# Patient Record
Sex: Female | Born: 1975 | Race: White | Hispanic: No | Marital: Married | Smoking: Former smoker
Health system: Southern US, Community
[De-identification: ages and names within clinical notes are randomized; demographics above are authoritative.]

## PROBLEM LIST (undated history)

## (undated) DIAGNOSIS — Z789 Other specified health status: Secondary | ICD-10-CM

---

## 2005-01-10 HISTORY — PX: MOLE REMOVAL: SHX2046

## 2007-12-25 ENCOUNTER — Ambulatory Visit: Payer: Self-pay | Admitting: Family Medicine

## 2007-12-25 DIAGNOSIS — J019 Acute sinusitis, unspecified: Secondary | ICD-10-CM

## 2007-12-25 DIAGNOSIS — J309 Allergic rhinitis, unspecified: Secondary | ICD-10-CM | POA: Insufficient documentation

## 2007-12-25 DIAGNOSIS — J45990 Exercise induced bronchospasm: Secondary | ICD-10-CM

## 2007-12-25 DIAGNOSIS — G43009 Migraine without aura, not intractable, without status migrainosus: Secondary | ICD-10-CM | POA: Insufficient documentation

## 2007-12-25 DIAGNOSIS — D239 Other benign neoplasm of skin, unspecified: Secondary | ICD-10-CM | POA: Insufficient documentation

## 2007-12-25 DIAGNOSIS — B9689 Other specified bacterial agents as the cause of diseases classified elsewhere: Secondary | ICD-10-CM

## 2008-05-28 ENCOUNTER — Inpatient Hospital Stay (HOSPITAL_COMMUNITY): Admission: AD | Admit: 2008-05-28 | Discharge: 2008-05-31 | Payer: Self-pay | Admitting: Obstetrics and Gynecology

## 2008-05-28 ENCOUNTER — Encounter (INDEPENDENT_AMBULATORY_CARE_PROVIDER_SITE_OTHER): Payer: Self-pay | Admitting: Obstetrics and Gynecology

## 2008-06-01 ENCOUNTER — Encounter: Admission: RE | Admit: 2008-06-01 | Discharge: 2008-06-03 | Payer: Self-pay | Admitting: Obstetrics and Gynecology

## 2008-06-14 ENCOUNTER — Ambulatory Visit: Payer: Self-pay | Admitting: Pediatrics

## 2009-02-23 ENCOUNTER — Telehealth: Payer: Self-pay | Admitting: Family Medicine

## 2009-04-16 ENCOUNTER — Ambulatory Visit: Payer: Self-pay | Admitting: Family Medicine

## 2009-08-20 ENCOUNTER — Ambulatory Visit: Payer: Self-pay | Admitting: Family Medicine

## 2009-08-20 DIAGNOSIS — J029 Acute pharyngitis, unspecified: Secondary | ICD-10-CM

## 2009-09-07 ENCOUNTER — Telehealth: Payer: Self-pay | Admitting: Family Medicine

## 2010-01-21 ENCOUNTER — Ambulatory Visit
Admission: RE | Admit: 2010-01-21 | Discharge: 2010-01-21 | Payer: Self-pay | Source: Home / Self Care | Attending: Family Medicine | Admitting: Family Medicine

## 2010-02-09 NOTE — Progress Notes (Signed)
Summary: call a nurse  Phone Note Call from Patient   Summary of Call: Triage Record Num: [id number] Operator: Joneen Boers Patient Name: Tasha Diaz Call Date & Time: 09/06/2009 8:36:10PM Patient Phone: [phone] PCP: Patient Gender: Female PCP Fax : Patient DOB: Oct 02, 1975 Practice Name: [facility] Reason for Call: Screener data: hit R leg at pool today and has a goose egg on it. is concerned. Rn triage: shinbone 2" below knee, size of a silver dollar, blue/purple in color, occured when rising form a chair and hit shinbone on the arm of it. Had one other unexplained bruise on upper thigh of same leg that has resolved. No other s/s bleeding . Leg injury protocol/all other situations/home care advice given. Protocol(s) Used: Leg Injury Recommended Outcome per Protocol: Provide Home/Self Care Reason for Outcome: All other situations Care Advice:  ~ Avoid activity that causes or worsens symptoms.  ~ See provider if pain continues for 7 days with home care.  ~ SYMPTOM / CONDITION MANAGEMENT Go to ED immediately if extremity: - is cool below injury - has a change in sensation (numbness, tingling) - has a noticeable change in color (pale, bluish)  ~ Limit Swelling and Reduce Pain: - Rest the painful area and limit weight bearing or gripping, and any strenuous exercise, especially repetitive motion, playing tennis, or any activity that makes the pain worse. - Apply a cloth-covered cold pack to the extremity for no more than 20 minutes, 4 to 8 times a day. Cold helps relieve pain and swelling. - Apply an elastic bandage to the extremity to limit the swelling. Do not wrap extremity too tightly. - Elevate the extremity above the level of the heart.  ~ 09/06/2009 8:43:43PM Page 1 of 1 [email or web address] Initial call taken by: Melody Comas,  September 07, 2009 8:50 AM

## 2010-02-09 NOTE — Assessment & Plan Note (Signed)
Summary: CONGESTION/COUGH/DLO   Vital Signs:  Patient profile:   35 year old female Height:      64 inches Weight:      130.4 pounds BMI:     22.46 Temp:     98.3 degrees F oral Pulse rate:   80 / minute Pulse rhythm:   regular BP sitting:   118 / 78  (left arm) Cuff size:   regular  Vitals Entered By: Benny Lennert CMA Duncan Dull) (April 16, 2009 3:56 PM)  History of Present Illness: Chief complaint cough and congestion for 70 weeks  35 year old:  In March, took some Claritin D, then around that term her son got a URI and some pink eye. Went to UC and everthing looksed OK.   Husband has been sick, too.   Started as a dry cough, then since then cough and junky stff   Now a thick, yellow discharge. HA  AR: claritin, allegra, zyrtec worsened allergies in addition   Conjunctivitis: Also, daughter has conj awoke this morning with R eye matted shut  Acute Visit History:      The patient complains of cough, earache, headache, nasal discharge, sinus problems, and sore throat.  These symptoms began 1 month ago.  She denies diarrhea, eye symptoms, nausea, rash, and vomiting.        The character of the cough is described as productive.  There is no history of wheezing, sleep interference, shortness of breath, respiratory retractions, tachypnea, cyanosis, or interference with oral intake associated with her cough.        The earache is located on the left side.  There have been 'cold' or URI symptoms associated with the earache.  There is no history of recent antibiotic usage or recurrent otitis media associated with the earache.        'Cold' or URI symptoms have been present with the sore throat.  There is no history of dysphagia, drooling, or recent exposure to strep.        She complains of sinus pressure, teeth aching, ears being blocked, nasal congestion, purulent drainage, and frontal headache.  The patient has had a past history of sinusitis.        Urine output has been normal.   She is tolerating clear liquids.        Allergies (verified): No Known Drug Allergies  Past History:  Past medical, surgical, family and social histories (including risk factors) reviewed, and no changes noted (except as noted below).  Past Medical History: Reviewed history from 12/25/2007 and no changes required. Allergic rhinitis  Past Surgical History: Reviewed history from 12/25/2007 and no changes required. Dysplastic mole removal 2007  Family History: Reviewed history from 12/25/2007 and no changes required. father: HTN mother: healthy sister: healthy PGM: unclear type of cancer, ? liver MGF: CVA MGM: DM, CAD  Social History: Reviewed history from 12/25/2007 and no changes required. Occupation: Presenter, broadcasting Married Never Smoked Alcohol use-no Drug use-no Regular exercise-yes,  running 3 times a week Diet: Fruits and veggies  Review of Systems       REVIEW OF SYSTEMS GEN: Acute illness details above. CV: No chest pain or SOB GI: No noted N or V Otherwise, pertinent positives and negatives are noted in the HPI.   Physical Exam  Additional Exam:  Gen: WDWN, NAD; alert,appropriate and cooperative throughout exam  HEENT: Normocephalic and atraumatic. Throat clear, w/o exudate, no LAD, R TM clear, L TM - good landmarks, No fluid present. rhinnorhea.  Left frontal and maxillary sinuses: Tender, max Right frontal and maxillary sinuses: Tender, max  R eye, injected  Neck: No ant or post LAD  CV: RRR, No M/G/R  Pulm: Breathing comfortably in no resp distress. no w/c/r  Abd: S,NT,ND,+BS  Extr: no c/c/e  Psych: full affect, pleasant    Impression & Recommendations:  Problem # 1:  SINUSITIS - ACUTE-NOS (ICD-461.9) Assessment New  Her updated medication list for this problem includes:    Fluticasone Propionate 50 Mcg/act Susp (Fluticasone propionate) .Marland Kitchen... 2 sprays each nostril once daily    Amoxicillin 500 Mg Tabs (Amoxicillin) .Marland KitchenMarland KitchenMarland KitchenMarland Kitchen 3 by mouth  two times a day (high dose sinusiits dosing)  Problem # 2:  CONJUNCTIVITIS (ICD-372.30) Assessment: New  Her updated medication list for this problem includes:    Polytrim 10000-0.1 Unit/ml-% Soln (Polymyxin b-trimethoprim) .Marland Kitchen... 1 gtt in affected eye qid x 7 days  Discussed treatment, and urged patient to wash hands carefully after touching face.   Problem # 3:  ALLERGIC RHINITIS (ICD-477.9) Assessment: Deteriorated  The following medications were removed from the medication list:    Promethazine Hcl 25 Mg Tabs (Promethazine hcl) .Marland Kitchen... 1 tab every 6 hours as needed for nausea. Her updated medication list for this problem includes:    Fluticasone Propionate 50 Mcg/act Susp (Fluticasone propionate) .Marland Kitchen... 2 sprays each nostril once daily  Discussed use of allergy medications and environmental measures.   Complete Medication List: 1)  Birth Control  2)  Fluticasone Propionate 50 Mcg/act Susp (Fluticasone propionate) .... 2 sprays each nostril once daily 3)  Polytrim 10000-0.1 Unit/ml-% Soln (Polymyxin b-trimethoprim) .Marland Kitchen.. 1 gtt in affected eye qid x 7 days 4)  Amoxicillin 500 Mg Tabs (Amoxicillin) .... 3 by mouth two times a day (high dose sinusiits dosing)  Patient Instructions: 1)  SINUSITIS 2)  Sinuses are cavities in facial skeleton that drain to nose. Impaired drainage and obstruction of sinus passages main cause. 3)  Treatment: 4)  1. Take all Antibiotics 5)  2. Open nasal and sinus canals: Oral decongestant: Sudafed. (CAUTION IF HIGH BLOOD PRESSURE) 6)  3. Steam inhalation 7)  4. Humidifier in room 8)  5. Frequent nasal saline irrigation 9)  6. Moist heat compresses to face 10)  7. Tylenol or Ibuprofen for pain and fever, follow directions on bottle.  Prescriptions: AMOXICILLIN 500 MG TABS (AMOXICILLIN) 3 by mouth two times a day (high dose sinusiits dosing)  #60 x 0   Entered and Authorized by:   Hannah Beat MD   Signed by:   Hannah Beat MD on 04/16/2009   Method  used:   Electronically to        [id number]       [address]       [place], [place]  [address]       Ph: [phone]       Fax: [phone]   RxID:   [id number] POLYTRIM 10000-0.1 UNIT/ML-%  SOLN (POLYMYXIN B-TRIMETHOPRIM) 1 gtt in affected eye qid x 7 days  #1 x 0   Entered and Authorized by:   Hannah Beat MD   Signed by:   Hannah Beat MD on 04/16/2009   Method used:   Electronically to        [id number]       [address]       [place], [place]  [address]       Ph: [phone]       Fax:  [phone]   RxID:   [id number] FLUTICASONE PROPIONATE 50 MCG/ACT  SUSP (FLUTICASONE PROPIONATE) 2 sprays each nostril once daily  #1 vial x 11   Entered and Authorized by:   Hannah Beat MD   Signed by:   Hannah Beat MD on 04/16/2009   Method used:   Electronically to        [id number]       [address]       [place], [place]  [address]       Ph: [phone]       Fax: [phone]   RxID:   [id number]   Current Allergies (reviewed today): No known allergies

## 2010-02-09 NOTE — Progress Notes (Signed)
Summary: Vomiting  Phone Note Call from Patient   Caller: Patient Call For: Ruthe Mannan, MD Summary of Call: Patient is vomiting profusely.  All symptoms sound like the GI virus.  In order to help avoid spreading the virus to the office staff and other patients, Phenergan will be [facility]oned [facility] Dr. Dayton Martes.  Patient Advised and asked to call in to the office in the early a.m. tomorrow if no improvement.   Initial call taken by: Delilah Shan CMA Duncan Dull),  February 23, 2009 8:55 AM  Follow-up for Phone Call        Agreed. Spoke with patient, she and husband are vomiting with diarrhea.  Friends have similar symptoms.   Pt advised to drink plenty of fluids, red flags given.  Will call in phenergan. Follow-up by: Ruthe Mannan MD,  February 23, 2009 8:57 AM    New/Updated Medications: PROMETHAZINE HCL 25 MG TABS (PROMETHAZINE HCL) 1 tab every 6 hours as needed for nausea. Prescriptions: PROMETHAZINE HCL 25 MG TABS (PROMETHAZINE HCL) 1 tab every 6 hours as needed for nausea.  #20 x 0   Entered and Authorized by:   Ruthe Mannan MD   Signed by:   Ruthe Mannan MD on 02/23/2009   Method used:   Electronically to        [id number]       [address]       [place], [place]  [address]       Ph: [phone]       Fax: [phone]   RxID:   [id number]

## 2010-02-09 NOTE — Assessment & Plan Note (Signed)
Summary: SORE THROAT/PAIN IN RIGHT EAR/RBH   Vital Signs:  Patient profile:   35 year old female Height:      64 inches Weight:      130.25 pounds BMI:     22.44 Temp:     98.6 degrees F oral Pulse rate:   92 / minute Pulse rhythm:   regular BP sitting:   106 / 68  (left arm) Cuff size:   regular  Vitals Entered By: Delilah Shan CMA Duncan Dull) (August 20, 2009 4:00 PM) CC: Right ear pain, sinus pressure   History of Present Illness: Sinus pain earlier in the week.  Took claritin.  ST on Wednesday and R ear pain.   No FCNAV.  35month old at home who has had a cough, possible virus.  No rash.  L ear feels fine.  No eye symptoms.  No cough.  Has rx for flonase, not used recently.    Allergies: No Known Drug Allergies  Review of Systems       See HPI.  Otherwise negative.    Physical Exam  General:  GEN: nad, alert and oriented HEENT: mucous membranes moist, TM w/o erythema, R SOM noted, nasal epithelium injected, OP with cobblestoning and punctate ulceration on the soft palate, no exudates NECK: supple w/o LA CV: rrr. PULM: ctab, no inc wob ABD: soft, +bs EXT: no edema  Weber localized to R ear   Impression & Recommendations:  Problem # 1:  PHARYNGITIS (ICD-462) Viral based on exam.   No indication for antibiotics.  Nontoxic.  ETD and SOM d/w patient.  Supportive tx.  follow up as needed. she understands.  The following medications were removed from the medication list:    Amoxicillin 500 Mg Tabs (Amoxicillin) .Marland KitchenMarland KitchenMarland KitchenMarland Kitchen 3 by mouth two times a day (high dose sinusiits dosing)  Complete Medication List: 1)  Birth Control  2)  Fluticasone Propionate 50 Mcg/act Susp (Fluticasone propionate) .... 2 sprays each nostril once daily 3)  Multivitamins Tabs (Multiple vitamin) .... Take 1 tablet by mouth once a day  Patient Instructions: 1)  Rest, fluids, and warm salt water gargles will help.  Use the flonase 2 sprays on each side.  Let me know if your hearing isn't back to normal in  2 weeks.   Current Allergies (reviewed today): No known allergies

## 2010-02-11 NOTE — Assessment & Plan Note (Signed)
Summary: ?SINUS INFECTION/CLE   Vital Signs:  Patient profile:   35 year old female Height:      64 inches Weight:      127.50 pounds BMI:     21.96 Temp:     98.9 degrees F oral Pulse rate:   86 / minute Pulse rhythm:   regular BP sitting:   90 / 60  (left arm) Cuff size:   regular  Vitals Entered By: Benny Lennert CMA Duncan Dull) (January 21, 2010 10:42 AM)  History of Present Illness: Chief complaint ? sinus infection  Within the last day or two, having a yellowish discharge and some pain in her ear.   Had an upset stomach yesterday and some diarrhea.   41 month old child.   Allergic rhinitis, almost always has to take claritin, flonase, occ excedrin.   Acute Visit History:      The patient complains of nasal discharge and sinus problems.  These symptoms began 3 days ago.  She denies chest pain, cough, headache, and musculoskeletal symptoms.  Other comments include: baseline severe allergies.        She complains of sinus pressure, teeth aching, ears being blocked, nasal congestion, purulent drainage, and frontal headache.  The patient has had a past history of sinusitis.        Urine output has been normal.  She is tolerating clear liquids.        Allergies (verified): No Known Drug Allergies  Past History:  Past medical, surgical, family and social histories (including risk factors) reviewed, and no changes noted (except as noted below).  Past Medical History: Reviewed history from 12/25/2007 and no changes required. Allergic rhinitis  Past Surgical History: Reviewed history from 12/25/2007 and no changes required. Dysplastic mole removal 2007  Family History: Reviewed history from 12/25/2007 and no changes required. father: HTN mother: healthy sister: healthy PGM: unclear type of cancer, ? liver MGF: CVA MGM: DM, CAD  Social History: Reviewed history from 12/25/2007 and no changes required. 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 CV: No chest pain or SOB GI: No noted N or V Otherwise, pertinent positives and negatives are noted in the HPI.   Physical Exam  Additional Exam:  Gen: WDWN, NAD; alert,appropriate and cooperative throughout exam  HEENT: Normocephalic and atraumatic. Throat clear, w/o exudate, no LAD, R TM clear, L TM - good landmarks, No fluid present. rhinnorhea.  Left frontal and maxillary sinuses: Tender, max Right frontal and maxillary sinuses: Tender, max  Neck: No ant or post LAD  CV: RRR, No M/G/R  Pulm: Breathing comfortably in no resp distress. no w/c/r  Abd: S,NT,ND,+BS  Extr: no c/c/e  Psych: full affect, pleasant    Impression & Recommendations:  Problem # 1:  SINUSITIS - ACUTE-NOS (ICD-461.9) Assessment New acute sinusitis, probably more from underlying AR with contained mucous  Her updated medication list for this problem includes:    Fluticasone Propionate 50 Mcg/act Susp (Fluticasone propionate) .Marland Kitchen... 2 sprays each nostril once daily    Amoxicillin 875 Mg Tabs (Amoxicillin) .Marland Kitchen... 1 by mouth two times a day  Problem # 2:  ALLERGIC RHINITIS (ICD-477.9) Assessment: Deteriorated discussed treatment, she may see ENT vs allergy if symptoms persist.  Her updated medication list for this problem includes:    Fluticasone Propionate 50  Mcg/act Susp (Fluticasone propionate) .Marland Kitchen... 2 sprays each nostril once daily  Complete Medication List: 1)  Birth Control  2)  Fluticasone Propionate 50 Mcg/act Susp (Fluticasone propionate) .... 2 sprays each nostril once daily 3)  Multivitamins Tabs (Multiple vitamin) .... Take 1 tablet by mouth once a day 4)  Amoxicillin 875 Mg Tabs (Amoxicillin) .Marland Kitchen.. 1 by mouth two times a day Prescriptions: AMOXICILLIN 875 MG TABS (AMOXICILLIN) 1 by mouth two times a day  #20 x 0    Entered and Authorized by:   Hannah Beat MD   Signed by:   Hannah Beat MD on 01/21/2010   Method used:   Electronically to        [id number]       [address]       [place], [place]  [address]       Ph: [phone]       Fax: [phone]   RxID:   [id number]    Orders Added: 1)  Est. Patient Level IV [24401]    Current Allergies (reviewed today): No known allergies

## 2010-04-20 LAB — CBC
HCT: 32.3 % — ABNORMAL LOW (ref 36.0–46.0)
HCT: 35.6 % — ABNORMAL LOW (ref 36.0–46.0)
Hemoglobin: 11.3 g/dL — ABNORMAL LOW (ref 12.0–15.0)
MCHC: 35.1 g/dL (ref 30.0–36.0)
MCV: 89.9 fL (ref 78.0–100.0)
MCV: 90.4 fL (ref 78.0–100.0)
Platelets: 217 10*3/uL (ref 150–400)
RDW: 12.7 % (ref 11.5–15.5)
RDW: 12.8 % (ref 11.5–15.5)

## 2010-04-20 LAB — RPR: RPR Ser Ql: NONREACTIVE

## 2010-05-25 NOTE — Op Note (Signed)
[name], Tasha Diaz             ACCOUNT NO.:  [id number]   MEDICAL RECORD NO.:  [id number]          PATIENT TYPE:  INP   LOCATION:  9127                          FACILITY:  [facility]   PHYSICIAN:  Duke Salvia. Marcelle Overlie, M.D.DATE OF BIRTH:  [birth year]   DATE OF PROCEDURE:  05/28/2008  DATE OF DISCHARGE:                               OPERATIVE REPORT   PREOPERATIVE DIAGNOSES:  1. 37 and 4/7 week intrauterine pregnancy.  2. Homero Fellers breech presentation.  3. Cervical dilatation 4-5 cm.  4. Oligohydramnios with fetal deceleration noted.   POSTOPERATIVE DIAGNOSES:  1. 37 and 4/7 week intrauterine pregnancy.  2. Homero Fellers breech presentation.  3. Cervical dilatation 4-5 cm.  4. Oligohydramnios with fetal deceleration noted.   PROCEDURE:  Primary low transverse cesarean sections.   SURGEON:  Duke Salvia. Marcelle Overlie, MD   ANESTHESIA:  Spinal.   COMPLICATIONS:  None.   DRAINS:  Foley catheter.   BLOOD LOSS:  700 mL.   PROCEDURE AND FINDINGS:  The patient was taken to the operating room.  After an adequate level of spinal anesthetic was obtained with the  patient in left tilt position, the abdomen was prepped and draped in  usual manner for sterile abdominal procedure.  Foley catheter inserted,  draining clear urine.  Two fingerbreadths above the symphysis.  After  prepping and draping, the Pfannenstiel incision was made, carried down  to the fascia which was incised and extended transversely.  Rectus  muscle was divided in midline.  Peritoneum entered superiorly without  incident and extended vertically.  The vesicouterine serosa was then  incised and the bladder was bluntly and sharply dissected below.  Bladder blade was repositioned.  Transverse incision made in the lower  segment extended with blunt dissection.  Clear fluid noted.  The infant  was noted to be frank breech, delivered easily by full breech extraction  a female.  PH was 7.33, Apgars 9 and 9.  The infant was suctioned, cord  clamped, and cut, and passed to pediatric team for further care.  The  placenta was delivered spontaneously intact, sent to Pathology.  Uterus  exteriorized, cavity was wiped cleaned with laparotomy pack.  Closure  obtained with first layer of 0 chromic in a locked fashion followed by  an imbricating layer of 0 chromic.  This was hemostatic.  Tubes and  ovaries were normal.  Prior to closure, sponge, needle, and instruments  counts reported as correct x2.  Peritoneum closed with a running 2-0  Vicryl suture.  Fascia closed from laterally to midline on either side  with a 0 PDS suture.  Subcutaneous tissue was minimal and was  hemostatic.  A 4-0 Monocryl on a PDS 1 was used in subcuticular fashion  to close the skin along with Steri-Strips.  A skin lesion on the intra-  abdominal wall was noted during prepping and the patient's  permission to excise, her request to excise was noted.  Excising the  ellipse, closed with interrupted 4-0 nylon sutures and a sterile  dressing.  She tolerated this well, went to recovery room in good  condition.  She did receive Ancef  1 g IV preop and Pitocin IV after cord  was clamped.      Richard M. Marcelle Overlie, M.D.  Electronically Signed     RM[facility]/MEDQ  D:  05/28/2008  T:  05/29/2008  Job:  [id number]

## 2010-05-25 NOTE — H&P (Signed)
NAMEJODY, AGUINAGA             ACCOUNT NO.:  [id number]   MEDICAL RECORD NO.:  [id number]          PATIENT TYPE:  INP   LOCATION:  9198                          FACILITY:  [facility]   PHYSICIAN:  Duke Salvia. Marcelle Overlie, M.D.DATE OF BIRTH:  08-Jul-1975   DATE OF ADMISSION:  05/28/2008  DATE OF DISCHARGE:                              HISTORY & PHYSICAL   CHIEF COMPLAINT:  37-1/2-week IUP, right breech presentation, cervical  dilatation, oligohydramnios with deceleration noted.   HISTORY OF PRESENT ILLNESS:  A 35 year old G1 P0 at 37-4/7ths weeks.  She was seen in the office today for routine exam and was noted be 4 to  5 cm and was sent to MAU for further evaluation.  Was not found to be in  active labor in MAU but ultrasound was ordered that showed frank breech  presentation and oligohydramnios.  When she was in  ultrasound  significant deep variable deceleration was noted. Decision made to  proceed with primary cesarean section.  This procedure including risks  related to bleeding, infection, transfusion, adjacent organ injury all  reviewed with her which she understands and accepts.   Blood type is A+, rubella titer is positive.  GBS is negative.   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  Please see [place] form for her family  and social history.   PHYSICAL EXAMINATION:  Temperature 98.2, blood pressure 120/72.  HEENT: Unremarkable.  NECK:  Soft, without mass.  LUNGS:  Clear.  CARDIOVASCULAR:  Regular rate and rhythm without murmurs, rubs or  gallops noted.  BREASTS:  Not examined.  37 cm fundal height.  Fetal heart rate 140,  breech presentation,  cervix  is 4 to 5 intact, breech presenting.  EXTREMITIES/ NEUROLOGIC:  Unremarkable.   IMPRESSION:  1. 37-4/7ths week intrauterine pregnancy.  2. Oligohydramnios, deceleration noted on ultrasound today.  3. Homero Fellers breech presentation.  4. Cervical dilatation 4 to 5  cm.   PLAN:  Primary low transverse cesarean section.  Procedure and risks  reviewed as above.      Richard M. Marcelle Overlie, M.D.  Electronically Signed     RM[facility]/MEDQ  D:  05/28/2008  T:  05/28/2008  Job:  [id number]

## 2010-05-28 NOTE — Discharge Summary (Signed)
Tasha Diaz, Tasha Diaz             ACCOUNT NO.:  [id number]   MEDICAL RECORD NO.:  [id number]          PATIENT TYPE:  INP   LOCATION:  9127                          FACILITY:  [facility]   PHYSICIAN:  Dineen Kid. Rana Snare, M.D.    DATE OF BIRTH:  11-07-1975   DATE OF ADMISSION:  05/28/2008  DATE OF DISCHARGE:  05/31/2008                               DISCHARGE SUMMARY   ADMITTING DIAGNOSES:  1. Intrauterine pregnancy at 37-4/7th weeks' estimated gestational      age.  2. Oligohydramnios with fetal deceleration.  3. Homero Fellers breech presentation.  4. Cervical dilatation at 5 cm.   DISCHARGE DIAGNOSES:  1. Status post low transverse cesarean section.  2. Viable female infant.   PROCEDURE:  Primary low transverse cesarean section.   REASON FOR ADMISSION:  Please see dictated H and P.   HOSPITAL COURSE:  The patient is 35 year old primigravida who was  admitted to [facility] at 37-4/7th weeks' estimated  gestational age.  The patient had been seen in the office for routine  exam and was noted to be 4-5 cm in dilatation and was sent to the MAU  for further evaluation.  On ultrasound, baby was noted to be in the  frank breech presentation and also was noted to have oligohydramnios.  While in ultrasound, a significant deep variable deceleration was noted,  and decision was made to proceed with a primary low transverse cesarean  section.  The patient was then transferred to the operating room where  spinal anesthesia was administered without difficulty.  A low transverse  incision was made with delivery of a viable female infant, weighing 6  pounds 3 ounces with Apgars of 9 at 1 minute and 9 at 5 minutes.  The  patient tolerated the procedure well and was taken to the recovery room  in stable condition.  On postoperative day 1, the patient was without  complaint.  Vital signs were stable.  She was afebrile.  Fundus firm and  nontender.  Abdominal dressing was noted to be clean, dry, and  intact.  Laboratory findings revealed hemoglobin of 11.3, platelet count 191,000,  WBC count of 14.8, blood type was noted to be A positive.  On  postoperative day 2, the patient was without complaint.  Vital signs  were stable.  She was afebrile.  Abdomen was soft with some decrease in  bowel sounds.  Fundus was firm and nontender.  Incision was clean, dry,  and intact with subcuticular closure.  The patient was encouraged to  ambulate and increase warm beverages.  On postoperative day 3, the  patient was without complaint.  Vital signs remained stable.  She was  afebrile.  Fundus firm and nontender.  Incision was clean, dry, and  intact.  Discharge instructions were reviewed, and the patient was later  discharged home.   CONDITION ON DISCHARGE:  Stable.   DIET:  Regular as tolerated.   ACTIVITY:  No heavy lifting, no driving x2 weeks, no vaginal entry.   FOLLOWUP:  The patient to follow up in the office in 1-2 weeks for an  incision check.  She is to call for temperature greater than 100  degrees, persistent nausea, vomiting, heavy vaginal bleeding, and/or  redness or drainage from the incisional site.   DISCHARGE MEDICATIONS:  1. Tylox #30 one p.o. every 4-6 hours.  2. Motrin 600 mg every 6 hours.  3. Prenatal vitamins 1 p.o. daily.  4. Colace n.p.o. daily.      Julio Sicks, N.P.      Dineen Kid Rana Snare, M.D.  Electronically Signed    CC/MEDQ  D:  06/15/2008  T:  06/16/2008  Job:  [id number]

## 2010-11-29 ENCOUNTER — Encounter: Payer: Self-pay | Admitting: Family Medicine

## 2010-11-29 ENCOUNTER — Ambulatory Visit (INDEPENDENT_AMBULATORY_CARE_PROVIDER_SITE_OTHER): Payer: BC Managed Care – PPO | Admitting: Family Medicine

## 2010-11-29 VITALS — BP 92/60 | HR 64 | Temp 98.3°F | Wt 130.8 lb

## 2010-11-29 DIAGNOSIS — J019 Acute sinusitis, unspecified: Secondary | ICD-10-CM

## 2010-11-29 MED ORDER — AMOXICILLIN-POT CLAVULANATE 875-125 MG PO TABS: 1.0000 | ORAL_TABLET | Freq: Two times a day (BID) | ORAL | Status: AC

## 2010-11-29 NOTE — Progress Notes (Signed)
SUBJECTIVE:  Tasha Diaz is a 35 y.o. female who complains of coryza, congestion, sneezing, sore throat, headache and bilateral sinus pain for 8 days. She denies a history of anorexia, chest pain, chills, nausea and shortness of breath and denies a history of asthma. Patient denies smoke cigarettes.   Patient Active Problem List  Diagnoses  . DYSPLASTIC NEVUS  . MIGRAINE, COMMON  . Acute Sinusitis, Unspecified  . PHARYNGITIS  . ALLERGIC RHINITIS  . ASTHMA, EXERCISE INDUCED, MILD   Past Medical History  Diagnosis Date  . Allergic rhinitis    Past Surgical History  Procedure Date  . Mole removal 2007    dysplastic   History  Substance Use Topics  . Smoking status: Never Smoker   . Smokeless tobacco: Not on file  . Alcohol Use: No   Family History  Problem Relation Age of Onset  . Hypertension Father   . Diabetes Maternal Grandmother   . Heart disease Maternal Grandmother   . Heart attack Maternal Grandfather   . Cancer Paternal Grandmother     ? liver   No Known Allergies No current outpatient prescriptions on file prior to visit.   The PMH, PSH, Social History, Family History, Medications, and allergies have been reviewed in [facility], and have been updated if relevant.  OBJECTIVE: BP 92/60  Pulse 64  Temp(Src) 98.3 F (36.8 C) (Oral)  Wt 130 lb 12 oz (59.308 kg)  LMP 11/22/2010  She appears well, vital signs are as noted. Ears normal.  Throat and pharynx normal.  Neck supple. No adenopathy in the neck. Nose is congested. Sinuses non tender. The chest is clear, without wheezes or rales.  ASSESSMENT:  sinusitis  PLAN: Given duration and progression of symptoms, will treat for bacterial sinusitis. Symptomatic therapy suggested: push fluids, rest and return office visit prn if symptoms persist or worsen.   Call or return to clinic prn if these symptoms worsen or fail to improve as anticipated.

## 2010-11-29 NOTE — Patient Instructions (Signed)
Take antibiotic as directed.  Drink lots of fluids.  Treat sympotmatically with Mucinex, nasal saline irrigation, and Tylenol/Ibuprofen. Also try claritin D or zyrtec D over the counter- two times a day as needed ( have to sign for them at pharmacy). You can use warm compresses.  Cough suppressant at night. Call if not improving as expected in 5-7 days.    

## 2011-01-06 ENCOUNTER — Ambulatory Visit (INDEPENDENT_AMBULATORY_CARE_PROVIDER_SITE_OTHER): Payer: BC Managed Care – PPO | Admitting: Family Medicine

## 2011-01-06 ENCOUNTER — Encounter: Payer: Self-pay | Admitting: Family Medicine

## 2011-01-06 VITALS — BP 110/70 | HR 64 | Temp 98.5°F | Wt 129.2 lb

## 2011-01-06 DIAGNOSIS — M79676 Pain in unspecified toe(s): Secondary | ICD-10-CM

## 2011-01-06 DIAGNOSIS — M79609 Pain in unspecified limb: Secondary | ICD-10-CM

## 2011-01-06 MED ORDER — SULFAMETHOXAZOLE-TRIMETHOPRIM 800-160 MG PO TABS: 1.0000 | ORAL_TABLET | Freq: Two times a day (BID) | ORAL | Status: AC

## 2011-01-06 NOTE — Progress Notes (Signed)
  Subjective:    Patient ID: Tasha Diaz, female    DOB: 07-Jul-1975, 35 y.o.   MRN: [id number]  HPI 35 yo here for right great toe pain.  Noticed a shooting pain in great toe two days ago, no redness or swelling. That has resolved. Noticed today that left lateral edge of toe nail bed is sore. No swelling, redness or drainage.  She is on her feet all day.  Has no h/o ingrown toenails.  Patient Active Problem List  Diagnoses  . DYSPLASTIC NEVUS  . MIGRAINE, COMMON  . Acute Sinusitis, Unspecified  . PHARYNGITIS  . ALLERGIC RHINITIS  . ASTHMA, EXERCISE INDUCED, MILD   Past Medical History  Diagnosis Date  . Allergic rhinitis    Past Surgical History  Procedure Date  . Mole removal 2007    dysplastic   History  Substance Use Topics  . Smoking status: Never Smoker   . Smokeless tobacco: Not on file  . Alcohol Use: No   Family History  Problem Relation Age of Onset  . Hypertension Father   . Diabetes Maternal Grandmother   . Heart disease Maternal Grandmother   . Heart attack Maternal Grandfather   . Cancer Paternal Grandmother     ? liver   No Known Allergies No current outpatient prescriptions on file prior to visit.   The PMH, PSH, Social History, Family History, Medications, and allergies have been reviewed in [facility], and have been updated if relevant.    Review of Systems See HPI  No fevers, chills, or swelling of joints.    Objective:   Physical Exam BP 110/70  Pulse 64  Temp(Src) 98.5 F (36.9 C) (Oral)  Wt 129 lb 4 oz (58.627 kg)  LMP 12/19/2010  General:  Well-developed,well-nourished,in no acute distress; alert,appropriate and cooperative throughout examination Head:  normocephalic and atraumatic.   Msk:  Right great toe- slightly ingrown, mildly TTP over lateral bed, no swelling or redness. Extremities:  No clubbing, cyanosis, edema, or deformity noted with normal full range of motion of all joints.   Neurologic:  alert & oriented X3 and  gait normal.   Skin:  Intact without suspicious lesions or rashes Psych:  Cognition and judgment appear intact. Alert and cooperative with normal attention span and concentration. No apparent delusions, illusions, hallucinations    Assessment & Plan:   1. Toe pain    New with ingrowing nail. Advised cutting her toe nails and soaking to hopefully prevent paronychia. Will also prophylactic ally place on Bactrim. See pt instructions for details.

## 2011-01-06 NOTE — Patient Instructions (Signed)
Good to see you. Have a Happy New Year. Please soak your foot in warm water with antibacterial soap for at least 10-15 minutes at a times, as much as you can tolerate. Wrap toe with bandaid with neosporin on it. Keep [place] posted with your symptoms.

## 2011-09-05 ENCOUNTER — Encounter: Payer: Self-pay | Admitting: Family Medicine

## 2011-09-05 ENCOUNTER — Ambulatory Visit (INDEPENDENT_AMBULATORY_CARE_PROVIDER_SITE_OTHER): Payer: BC Managed Care – PPO | Admitting: Family Medicine

## 2011-09-05 VITALS — BP 109/66 | HR 63 | Temp 98.2°F | Ht 64.0 in | Wt 124.0 lb

## 2011-09-05 DIAGNOSIS — R21 Rash and other nonspecific skin eruption: Secondary | ICD-10-CM | POA: Insufficient documentation

## 2011-09-05 NOTE — Assessment & Plan Note (Signed)
Diffuse/ trunk and proximal arms -- with itching after trip to the beach  Differential includes allergic reaction/ topical vs hot tub folliculitis Will tx with antihistamine oral (zyrtec) , changing products to color and fragrance free, and obs If not imp may need re eval or course of prednisone  She will keep me updated

## 2011-09-05 NOTE — Progress Notes (Signed)
  Subjective:    Patient ID: Tasha Diaz, female    DOB: 1975-03-24, 36 y.o.   MRN: [id number]  HPI  Here for a rash - started over her pelvic area and abdomen- then spread to her arms  Thought it was heat rash at first Very itchy - but not keeping her up all night No fever Tried some benadryl cream - help a little No oral antihist   Was at the beach - stayed with parents Was in hot tub/ pool and ocean   ? If any new products- lotion/ sunscreen/ shower gel   Patient Active Problem List  Diagnosis  . DYSPLASTIC NEVUS  . MIGRAINE, COMMON  . Acute Sinusitis, Unspecified  . PHARYNGITIS  . ALLERGIC RHINITIS  . ASTHMA, EXERCISE INDUCED, MILD  . Toe pain   Past Medical History  Diagnosis Date  . Allergic rhinitis    Past Surgical History  Procedure Date  . Mole removal 2007    dysplastic   History  Substance Use Topics  . Smoking status: Former Games developer  . Smokeless tobacco: Not on file  . Alcohol Use: Yes   Family History  Problem Relation Age of Onset  . Hypertension Father   . Diabetes Maternal Grandmother   . Heart disease Maternal Grandmother   . Heart attack Maternal Grandfather   . Cancer Paternal Grandmother     ? liver   No Known Allergies Current Outpatient Prescriptions on File Prior to Visit  Medication Sig Dispense Refill  . norethindrone-ethinyl estradiol (JUNEL FE,GILDESS FE,LOESTRIN FE) 1-20 MG-MCG tablet Take 1 tablet by mouth daily.              Review of Systems Review of Systems  Constitutional: Negative for fever, appetite change, fatigue and unexpected weight change.  Eyes: Negative for pain and visual disturbance.  Respiratory: Negative for cough and shortness of breath.   Cardiovascular: Negative for cp or palpitations    Gastrointestinal: Negative for nausea, diarrhea and constipation.  Genitourinary: Negative for urgency and frequency.  Skin: Negative for pallor or and pos for rash with itching  Neurological: Negative for  weakness, light-headedness, numbness and headaches.  Hematological: Negative for adenopathy. Does not bruise/bleed easily.  Psychiatric/Behavioral: Negative for dysphoric mood. The patient is not nervous/anxious.         Objective:   Physical Exam  Constitutional: She appears well-developed and well-nourished. No distress.  HENT:  Head: Normocephalic and atraumatic.  Mouth/Throat: Oropharynx is clear and moist.  Eyes: EOM are normal. Pupils are equal, round, and reactive to light. Right eye exhibits no discharge. Left eye exhibits no discharge.  Neck: Normal range of motion. Neck supple.  Pulmonary/Chest: Effort normal and breath sounds normal. She has no wheezes.  Lymphadenopathy:    She has no cervical adenopathy.  Skin: Skin is warm and dry. Rash noted. There is erythema. No pallor.       Diffuse rash on trunk and upper ext  Papules of diff sizes / no vesicles  Some erythema-no scale No open areas   Psychiatric: She has a normal mood and affect.          Assessment & Plan:

## 2011-09-05 NOTE — Patient Instructions (Addendum)
I think you most likely either have an allergic reaction to something topical or hot tub folliculitis Use zyrtec 10 mg one pill daily over the counter -until improved  Use fragrance and color free products -- like dove soap for sensitive skin/ lubriderm lotion for sensitive skin  (or cetaphil)  No perfume No scented body wash  Look for a children's sunscreen for sensitive skin  If not improving in a week -or if worse- let me know

## 2011-10-01 ENCOUNTER — Emergency Department: Payer: Self-pay | Admitting: Emergency Medicine

## 2011-10-01 LAB — URINALYSIS, COMPLETE
Nitrite: NEGATIVE
Protein: NEGATIVE

## 2011-10-01 LAB — HCG, QUANTITATIVE, PREGNANCY: Beta Hcg, Quant.: 346 m[IU]/mL — ABNORMAL HIGH

## 2011-12-13 ENCOUNTER — Encounter: Payer: Self-pay | Admitting: Family Medicine

## 2011-12-13 ENCOUNTER — Ambulatory Visit (INDEPENDENT_AMBULATORY_CARE_PROVIDER_SITE_OTHER): Payer: BC Managed Care – PPO | Admitting: Family Medicine

## 2011-12-13 VITALS — BP 108/60 | HR 50 | Temp 99.1°F | Wt 137.0 lb

## 2011-12-13 DIAGNOSIS — J019 Acute sinusitis, unspecified: Secondary | ICD-10-CM

## 2011-12-13 MED ORDER — AMOXICILLIN-POT CLAVULANATE 875-125 MG PO TABS: 1.0000 | ORAL_TABLET | Freq: Two times a day (BID) | ORAL | Status: AC

## 2011-12-13 NOTE — Progress Notes (Signed)
  Subjective:    Patient ID: Tasha Diaz, female    DOB: 1975/12/21, 36 y.o.   MRN: [id number]  HPI CC: sinusitis?  [redacted] wks pregnant.  2 wk h/o yellow drainage from nose as well as sinus pressure.  Then over wekeend started having worsening PNDRainage with ST.  Cough with mild phlegm.  Recently worsening sinus pressure pain, headache (pressure pain), bilateral ear pressure.  + chills.  Appetite down, mild nausea.  Taking tylenol cold.  No fevers.  Husband and son recently sick, on antibiotics (ear infection, bronchitis and sinus infection). No smokers at home. No h/o asthma.  Past Medical History  Diagnosis Date  . Allergic rhinitis      Review of Systems Per HPI    Objective:   Physical Exam  Nursing note and vitals reviewed. Constitutional: She appears well-developed and well-nourished. No distress.  HENT:  Head: Normocephalic and atraumatic.  Right Ear: Hearing, tympanic membrane, external ear and ear canal normal.  Left Ear: Hearing, tympanic membrane, external ear and ear canal normal.  Nose: Mucosal edema present. No rhinorrhea. Right sinus exhibits maxillary sinus tenderness and frontal sinus tenderness. Left sinus exhibits maxillary sinus tenderness and frontal sinus tenderness.  Mouth/Throat: Uvula is midline and mucous membranes are normal. Posterior oropharyngeal erythema present. No oropharyngeal exudate, posterior oropharyngeal edema or tonsillar abscesses.  Eyes: Conjunctivae normal and EOM are normal. Pupils are equal, round, and reactive to light. No scleral icterus.  Neck: Normal range of motion. Neck supple.  Cardiovascular: Normal rate, regular rhythm, normal heart sounds and intact distal pulses.   No murmur heard. Pulmonary/Chest: Effort normal and breath sounds normal. No respiratory distress. She has no wheezes. She has no rales.  Lymphadenopathy:    She has no cervical adenopathy.  Skin: Skin is warm and dry. No rash noted.       Assessment  & Plan:

## 2011-12-13 NOTE — Assessment & Plan Note (Signed)
Given duration and progression, will treat as bacterial sinusitis with augmentin. Reviewed amox and augmentin, both pregnancy category B. rec use tylenol prn as up to now, may use simple guaifenesin if feels congestion continues despite abx. Reviewed reasons to return ie not improving as expected or any worsening.

## 2011-12-13 NOTE — Patient Instructions (Signed)
You have a sinus infection. Take medicine as prescribed: augmentin twice daily for 10 days. Push fluids and plenty of rest. Nasal saline irrigation or neti pot to help drain sinuses. May use simple mucinex or immediate release guaifenesin (ensure no alcohol in formulation) with plenty of fluid to help mobilize mucous. Let us know if fever >101.5, trouble opening/closing mouth, difficulty swallowing, or worsening - you may need to be seen again.

## 2012-03-05 ENCOUNTER — Observation Stay: Payer: Self-pay | Admitting: Obstetrics and Gynecology

## 2012-03-05 LAB — URINALYSIS, COMPLETE
Bilirubin,UR: NEGATIVE
Nitrite: NEGATIVE
Ph: 5 (ref 4.5–8.0)

## 2012-05-18 ENCOUNTER — Encounter (HOSPITAL_COMMUNITY): Payer: Self-pay | Admitting: *Deleted

## 2012-05-18 ENCOUNTER — Encounter (HOSPITAL_COMMUNITY): Payer: Self-pay | Admitting: Anesthesiology

## 2012-05-18 ENCOUNTER — Encounter (HOSPITAL_COMMUNITY)
Admission: AD | Disposition: A | Discharge status: Home / Self Care | Payer: Self-pay | Source: Ambulatory Visit | Attending: Obstetrics & Gynecology

## 2012-05-18 ENCOUNTER — Inpatient Hospital Stay (HOSPITAL_COMMUNITY): Payer: BC Managed Care – PPO | Admitting: Anesthesiology

## 2012-05-18 ENCOUNTER — Inpatient Hospital Stay (HOSPITAL_COMMUNITY)
Admission: AD | Admit: 2012-05-18 | Discharge: 2012-05-21 | DRG: 371 | Disposition: A | Discharge status: Home / Self Care | Payer: BC Managed Care – PPO | Source: Ambulatory Visit | Attending: Obstetrics & Gynecology | Admitting: Obstetrics & Gynecology

## 2012-05-18 ENCOUNTER — Encounter (HOSPITAL_COMMUNITY): Payer: Self-pay

## 2012-05-18 DIAGNOSIS — O34219 Maternal care for unspecified type scar from previous cesarean delivery: Secondary | ICD-10-CM | POA: Diagnosis present

## 2012-05-18 DIAGNOSIS — O321XX0 Maternal care for breech presentation, not applicable or unspecified: Principal | ICD-10-CM | POA: Diagnosis present

## 2012-05-18 HISTORY — DX: Other specified health status: Z78.9

## 2012-05-18 LAB — CBC
MCH: 29.5 pg (ref 26.0–34.0)
MCHC: 34.3 g/dL (ref 30.0–36.0)
Platelets: 177 10*3/uL (ref 150–400)
RDW: 12.5 % (ref 11.5–15.5)

## 2012-05-18 LAB — TYPE AND SCREEN

## 2012-05-18 SURGERY — Surgical Case
Anesthesia: Spinal | Wound class: Clean Contaminated

## 2012-05-18 MED ORDER — LACTATED RINGERS IV SOLN
INTRAVENOUS | Status: DC | PRN
  Administered 2012-05-18: 23:00:00 via INTRAVENOUS

## 2012-05-18 MED ORDER — OXYTOCIN 10 UNIT/ML IJ SOLN
40.0000 [IU] | INTRAMUSCULAR | Status: DC | PRN
  Administered 2012-05-18: 40 [IU] via INTRAVENOUS

## 2012-05-18 MED ORDER — ONDANSETRON HCL 4 MG/2ML IJ SOLN
INTRAMUSCULAR | Status: DC | PRN
  Administered 2012-05-18: 4 mg via INTRAVENOUS

## 2012-05-18 MED ORDER — FENTANYL CITRATE 0.05 MG/ML IJ SOLN
INTRAMUSCULAR | Status: DC | PRN
  Administered 2012-05-18: 15 ug via INTRATHECAL

## 2012-05-18 MED ORDER — FAMOTIDINE IN NACL 20-0.9 MG/50ML-% IV SOLN
20.0000 mg | Freq: Once | INTRAVENOUS | Status: AC
  Administered 2012-05-18: 20 mg via INTRAVENOUS
  Filled 2012-05-18: qty 50

## 2012-05-18 MED ORDER — LACTATED RINGERS IV SOLN
INTRAVENOUS | Status: DC
  Administered 2012-05-18: 22:00:00 via INTRAVENOUS

## 2012-05-18 MED ORDER — CITRIC ACID-SODIUM CITRATE 334-500 MG/5ML PO SOLN
30.0000 mL | Freq: Once | ORAL | Status: AC
  Administered 2012-05-18: 30 mL via ORAL
  Filled 2012-05-18: qty 15

## 2012-05-18 MED ORDER — MORPHINE SULFATE (PF) 0.5 MG/ML IJ SOLN
INTRAMUSCULAR | Status: DC | PRN
  Administered 2012-05-18: .1 ug via INTRATHECAL

## 2012-05-18 MED ORDER — METOCLOPRAMIDE HCL 5 MG/ML IJ SOLN
10.0000 mg | Freq: Once | INTRAMUSCULAR | Status: AC
  Administered 2012-05-18: 10 mg via INTRAVENOUS
  Filled 2012-05-18: qty 2

## 2012-05-18 MED ORDER — CEFAZOLIN SODIUM-DEXTROSE 2-3 GM-% IV SOLR
INTRAVENOUS | Status: DC | PRN
  Administered 2012-05-18: 2 g via INTRAVENOUS

## 2012-05-18 MED ORDER — BUPIVACAINE IN DEXTROSE 0.75-8.25 % IT SOLN
INTRATHECAL | Status: DC | PRN
  Administered 2012-05-18: 10.75 mg via INTRATHECAL

## 2012-05-18 MED ORDER — PHENYLEPHRINE HCL 10 MG/ML IJ SOLN
INTRAMUSCULAR | Status: DC | PRN
  Administered 2012-05-18: 80 ug via INTRAVENOUS

## 2012-05-18 SURGICAL SUPPLY — 28 items
CLOTH BEACON ORANGE TIMEOUT ST (SAFETY) ×2 IMPLANT
DERMABOND ADVANCED (GAUZE/BANDAGES/DRESSINGS)
DERMABOND ADVANCED .7 DNX12 (GAUZE/BANDAGES/DRESSINGS) IMPLANT
DRAPE LG THREE QUARTER DISP (DRAPES) ×2 IMPLANT
DRSG OPSITE POSTOP 4X10 (GAUZE/BANDAGES/DRESSINGS) ×2 IMPLANT
DURAPREP 26ML APPLICATOR (WOUND CARE) ×2 IMPLANT
ELECT REM PT RETURN 9FT ADLT (ELECTROSURGICAL) ×2
ELECTRODE REM PT RTRN 9FT ADLT (ELECTROSURGICAL) ×1 IMPLANT
EXTRACTOR VACUUM M CUP 4 TUBE (SUCTIONS) IMPLANT
GLOVE BIO SURGEON STRL SZ 6 (GLOVE) ×2 IMPLANT
GLOVE BIOGEL PI IND STRL 6 (GLOVE) ×2 IMPLANT
GLOVE BIOGEL PI INDICATOR 6 (GLOVE) ×2
GOWN STRL REIN XL XLG (GOWN DISPOSABLE) ×4 IMPLANT
KIT ABG SYR 3ML LUER SLIP (SYRINGE) ×2 IMPLANT
NEEDLE HYPO 25X5/8 SAFETYGLIDE (NEEDLE) ×2 IMPLANT
NS IRRIG 1000ML POUR BTL (IV SOLUTION) ×2 IMPLANT
PACK C SECTION WH (CUSTOM PROCEDURE TRAY) ×2 IMPLANT
PAD OB MATERNITY 4.3X12.25 (PERSONAL CARE ITEMS) ×2 IMPLANT
STAPLER VISISTAT 35W (STAPLE) IMPLANT
SUT CHROMIC 0 CTX 36 (SUTURE) ×6 IMPLANT
SUT MON AB 2-0 CT1 27 (SUTURE) ×2 IMPLANT
SUT PDS AB 0 CT1 27 (SUTURE) IMPLANT
SUT PLAIN 0 NONE (SUTURE) IMPLANT
SUT VIC AB 0 CT1 36 (SUTURE) IMPLANT
SUT VIC AB 4-0 KS 27 (SUTURE) IMPLANT
TOWEL OR 17X24 6PK STRL BLUE (TOWEL DISPOSABLE) ×6 IMPLANT
TRAY FOLEY CATH 14FR (SET/KITS/TRAYS/PACK) IMPLANT
WATER STERILE IRR 1000ML POUR (IV SOLUTION) ×2 IMPLANT

## 2012-05-18 NOTE — MAU Note (Signed)
Pt. Went to take a shower tonite at 2000 and had one contraction and saw mucous come out. Also, when in the shower felt more fluid come out. Pt. Was given pad and after using the restroom started to leak more fluid that is pinkish in color. Now is having regular contractions per pt. Denies any decreased fetal movement. Scheduled for repeat c-section May 23. To see MD May 14. Just saw MD Wednesday and was 2-3cm and 50% per pt.

## 2012-05-18 NOTE — Anesthesia Procedure Notes (Signed)
Spinal  Patient location during procedure: OR Start time: 05/18/2012 11:13 PM Staffing Performed by: anesthesiologist  Preanesthetic Checklist Completed: patient identified, site marked, surgical consent, pre-op evaluation, timeout performed, IV checked, risks and benefits discussed and monitors and equipment checked Spinal Block Patient position: sitting Prep: site prepped and draped and DuraPrep Patient monitoring: heart rate, cardiac monitor, continuous pulse ox and blood pressure Approach: midline Location: L3-4 Injection technique: single-shot Needle Needle type: Sprotte  Needle gauge: 24 G Needle length: 9 cm Assessment Sensory level: T4 Additional Notes Clear free flow CSF on first attempt.  No paresthesia.  Patient tolerated procedure well with no apparent complications.  Jasmine December, MD

## 2012-05-18 NOTE — H&P (Signed)
Tasha Diaz is a 37 y.o. female presenting for labor; SROM at 21 with CTX.  No VB.  +FM.  H/O prior C/S and desires repeat.  Antepartum course was uncomplicated.  GBS negative.   Maternal Medical History:  Reason for admission: Rupture of membranes.   Contractions: Onset was 1-2 hours ago.   Frequency: regular.   Perceived severity is moderate.    Fetal activity: Perceived fetal activity is normal.   Last perceived fetal movement was within the past hour.    Prenatal complications: no prenatal complications Prenatal Complications - Diabetes: none.    OB History   Grav Para Term Preterm Abortions TAB SAB Ect Mult Living   2 1             Past Medical History  Diagnosis Date  . Allergic rhinitis   . Medical history non-contributory    Past Surgical History  Procedure Laterality Date  . Mole removal  2007    dysplastic  . Cesarean section  2010   Family History: family history includes Cancer in her paternal grandmother; Diabetes in her maternal grandmother; Heart attack in her maternal grandfather; Heart disease in her maternal grandmother; and Hypertension in her father. Social History:  reports that she has quit smoking. She has never used smokeless tobacco. She reports that she does not drink alcohol or use illicit drugs.   Prenatal Transfer Tool  Maternal Diabetes: No Genetic Screening: Normal Maternal Ultrasounds/Referrals: Normal Fetal Ultrasounds or other Referrals:  None Maternal Substance Abuse:  No Significant Maternal Medications:  None Significant Maternal Lab Results:  None Other Comments:  None  ROS  Dilation: 4 Effacement (%): 80 Station: -2 Exam by:: Quintella Baton RNC Blood pressure 136/76, pulse 82, temperature 98.3 F (36.8 C), resp. rate 20, height 5\' 4"  (1.626 m), weight 151 lb 3.2 oz (68.584 kg), last menstrual period 09/02/2011, SpO2 100.00%. Maternal Exam:  Uterine Assessment: Contraction strength is moderate.  Contraction frequency is  regular.   Abdomen: Patient reports no abdominal tenderness. Surgical scars: low transverse.   Fundal height is c/w dates.   Estimated fetal weight is 6#.   Fetal presentation: breech  Introitus: Ferning test: positive.  Amniotic fluid character: clear.     Physical Exam  Constitutional: She is oriented to person, place, and time. She appears well-developed and well-nourished.  GI: Soft. There is no rebound and no guarding.  Neurological: She is alert and oriented to person, place, and time.  Skin: Skin is warm and dry.  Psychiatric: She has a normal mood and affect. Her behavior is normal.    Prenatal labs: ABO, Rh:   Antibody:   Rubella:   RPR:    HBsAg:    HIV:    GBS:     Assessment/Plan: 37yo G2P1 at [redacted]w[redacted]d with labor, h/o prior C/S -Proceed to Rpt. C/S.     Tasha Diaz 05/18/2012, 10:01 PM

## 2012-05-18 NOTE — Op Note (Signed)
[name] PROCEDURE DATE: 05/18/2012  PREOPERATIVE DIAGNOSIS: Intrauterine pregnancy at  [redacted]w[redacted]d weeks gestation, history of previous C/S, labor, breech presentation  POSTOPERATIVE DIAGNOSIS: The same  PROCEDURE:    Repeat Low Transverse Cesarean Section  SURGEON:  Dr. Mitchel Honour  INDICATIONS: Tasha Diaz is a 37 y.o. G2P1001 at [redacted]w[redacted]d scheduled for cesarean section secondary to desire for repeat and breech presentation.  The risks of cesarean section discussed with the patient included but were not limited to: bleeding which may require transfusion or reoperation; infection which may require antibiotics; injury to bowel, bladder, ureters or other surrounding organs; injury to the fetus; need for additional procedures including hysterectomy in the event of a life-threatening hemorrhage; placental abnormalities wth subsequent pregnancies, incisional problems, thromboembolic phenomenon and other postoperative/anesthesia complications. The patient concurred with the proposed plan, giving informed written consent for the procedure.    FINDINGS:  Viable female infant at 2327 in frank breech presentation, APGARs 9,9:  Weight pending.  clear amniotic fluid.  Intact placenta, three vessel cord.  Grossly normal uterus, ovaries and fallopian tubes. .   ANESTHESIA:    Spinal ESTIMATED BLOOD LOSS: 500 ml SPECIMENS: Placenta sent to L&D COMPLICATIONS: None immediate  PROCEDURE IN DETAIL:  The patient received intravenous antibiotics and had sequential compression devices applied to her lower extremities while in the preoperative area.  She was then taken to the operating room where spinal anesthesia was administered and was found to be adequate. She was then placed in a dorsal supine position with a leftward tilt, and prepped and draped in a sterile manner.  A foley catheter was placed into her bladder and attached to constant gravity.  After an adequate timeout was performed, a Pfannenstiel skin  incision was made with scalpel and carried through to the underlying layer of fascia. The fascia was incised in the midline and this incision was extended bilaterally using the Mayo scissors. Kocher clamps were applied to the superior aspect of the fascial incision and the underlying rectus muscles were dissected off bluntly. A similar process was carried out on the inferior aspect of the facial incision. The rectus muscles were separated in the midline bluntly and the peritoneum was entered bluntly.  A bladder flap was created sharply and pushed down bluntly.  It was protected behind the bladder blade.  A transverse hysterotomy was made with a scalpel and extended bilaterally bluntly. The bladder blade was then removed. The infant was successfully delivered from frank breech presentation, and cord was clamped and cut and infant was handed over to awaiting neonatology team. Uterine massage was then administered and the placenta delivered intact with three-vessel cord. The uterus was cleared of clot and debris.  The hysterotomy was closed with #1 Chromic.  A second imbricating suture of #1 Chromic was used to reinforce the incision and aid in hemostasis.  The peritoneum and rectus muscles were noted to be hemostatic and were reapproximated using 2-0 Monocryl in a running fashion.  The fascia was closed with single loop PDS in a running fashion with good restoration of anatomy.  The subcutaneus tissue was copiously irrigated.  The skin was closed with staples.  Pt tolerated the procedure will.  All counts were correct x2.  Pt went to the recovery room in stable condition.

## 2012-05-18 NOTE — MAU Note (Signed)
Just before getting in shower ? Leaked fld. Felt pop in shower and leaked fld. No fld since. Contractions started soon after shower and continue. For repeat C/s 5/23

## 2012-05-18 NOTE — Anesthesia Preprocedure Evaluation (Addendum)
Anesthesia Evaluation  Patient identified by MRN, date of birth, ID band Patient awake    Reviewed: Allergy & Precautions, H&P , NPO status , Patient's Chart, lab work & pertinent test results, reviewed documented beta blocker date and time   History of Anesthesia Complications Negative for: history of anesthetic complications  Airway Mallampati: II TM Distance: >3 FB Neck ROM: full    Dental  (+) Teeth Intact   Pulmonary neg pulmonary ROS,  breath sounds clear to auscultation        Cardiovascular negative cardio ROS  Rhythm:regular Rate:Normal     Neuro/Psych negative neurological ROS  negative psych ROS   GI/Hepatic negative GI ROS, Neg liver ROS,   Endo/Other  negative endocrine ROS  Renal/GU negative Renal ROS  negative genitourinary   Musculoskeletal   Abdominal   Peds  Hematology negative hematology ROS (+)   Anesthesia Other Findings Ate dinner and drank milk at 1830    Reproductive/Obstetrics (+) Pregnancy (h/o prior c/s, breech, SROM, labor)                           Anesthesia Physical Anesthesia Plan  ASA: II and emergent  Anesthesia Plan: Spinal   Post-op Pain Management:    Induction:   Airway Management Planned:   Additional Equipment:   Intra-op Plan:   Post-operative Plan:   Informed Consent: I have reviewed the patients History and Physical, chart, labs and discussed the procedure including the risks, benefits and alternatives for the proposed anesthesia with the patient or authorized representative who has indicated his/her understanding and acceptance.     Plan Discussed with: Surgeon and CRNA  Anesthesia Plan Comments: (Ordered reglan and pepcid IV and bicitra PO prior to OR  Awaiting lab results)       Anesthesia Quick Evaluation

## 2012-05-18 NOTE — Progress Notes (Signed)
Counseled re: risk of bleeding (possibly requiring blood transfusion or life-saving C-hyst), infection, damage to surrounding structures including the fetus, bowel and bladder.  The patient understands the implications in future pregnancies.  Patient wishes to proceed.    Mitchel Honour, DO

## 2012-05-19 LAB — CBC
HCT: 29.3 % — ABNORMAL LOW (ref 36.0–46.0)
Hemoglobin: 10 g/dL — ABNORMAL LOW (ref 12.0–15.0)
MCH: 29.3 pg (ref 26.0–34.0)
MCHC: 34.1 g/dL (ref 30.0–36.0)

## 2012-05-19 LAB — RPR: RPR Ser Ql: NONREACTIVE

## 2012-05-19 MED ORDER — KETOROLAC TROMETHAMINE 30 MG/ML IJ SOLN: 30.0000 mg | Freq: Four times a day (QID) | INTRAMUSCULAR | Status: AC | PRN

## 2012-05-19 MED ORDER — DIPHENHYDRAMINE HCL 50 MG/ML IJ SOLN: 25.0000 mg | INTRAMUSCULAR | Status: DC | PRN

## 2012-05-19 MED ORDER — OXYCODONE-ACETAMINOPHEN 5-325 MG PO TABS
1.0000 | ORAL_TABLET | ORAL | Status: DC | PRN
  Administered 2012-05-19 – 2012-05-20 (×2): 2 via ORAL
  Administered 2012-05-20 – 2012-05-21 (×3): 1 via ORAL
  Filled 2012-05-19: qty 2
  Filled 2012-05-19: qty 1
  Filled 2012-05-19: qty 2
  Filled 2012-05-19 (×2): qty 1

## 2012-05-19 MED ORDER — NALBUPHINE HCL 10 MG/ML IJ SOLN
5.0000 mg | INTRAMUSCULAR | Status: DC | PRN
  Filled 2012-05-19: qty 1

## 2012-05-19 MED ORDER — PRENATAL MULTIVITAMIN CH
1.0000 | ORAL_TABLET | Freq: Every day | ORAL | Status: DC
  Administered 2012-05-19 – 2012-05-21 (×3): 1 via ORAL
  Filled 2012-05-19 (×3): qty 1

## 2012-05-19 MED ORDER — SENNOSIDES-DOCUSATE SODIUM 8.6-50 MG PO TABS
2.0000 | ORAL_TABLET | Freq: Every day | ORAL | Status: DC
  Administered 2012-05-19 – 2012-05-20 (×2): 2 via ORAL

## 2012-05-19 MED ORDER — SIMETHICONE 80 MG PO CHEW
80.0000 mg | CHEWABLE_TABLET | ORAL | Status: DC | PRN
  Administered 2012-05-19 (×2): 80 mg via ORAL

## 2012-05-19 MED ORDER — FENTANYL CITRATE 0.05 MG/ML IJ SOLN
INTRAMUSCULAR | Status: AC
  Filled 2012-05-19: qty 2

## 2012-05-19 MED ORDER — NALOXONE HCL 1 MG/ML IJ SOLN
1.0000 ug/kg/h | INTRAVENOUS | Status: DC | PRN
  Filled 2012-05-19: qty 2

## 2012-05-19 MED ORDER — WITCH HAZEL-GLYCERIN EX PADS: 1.0000 "application " | MEDICATED_PAD | CUTANEOUS | Status: DC | PRN

## 2012-05-19 MED ORDER — DIBUCAINE 1 % RE OINT: 1.0000 "application " | TOPICAL_OINTMENT | RECTAL | Status: DC | PRN

## 2012-05-19 MED ORDER — ZOLPIDEM TARTRATE 5 MG PO TABS: 5.0000 mg | ORAL_TABLET | Freq: Every evening | ORAL | Status: DC | PRN

## 2012-05-19 MED ORDER — KETOROLAC TROMETHAMINE 30 MG/ML IJ SOLN
30.0000 mg | Freq: Four times a day (QID) | INTRAMUSCULAR | Status: AC | PRN
  Administered 2012-05-19 (×2): 30 mg via INTRAVENOUS
  Filled 2012-05-19 (×2): qty 1

## 2012-05-19 MED ORDER — KETOROLAC TROMETHAMINE 60 MG/2ML IM SOLN
60.0000 mg | Freq: Once | INTRAMUSCULAR | Status: AC | PRN
  Administered 2012-05-19: 60 mg via INTRAMUSCULAR

## 2012-05-19 MED ORDER — METOCLOPRAMIDE HCL 5 MG/ML IJ SOLN: 10.0000 mg | Freq: Three times a day (TID) | INTRAMUSCULAR | Status: DC | PRN

## 2012-05-19 MED ORDER — LANOLIN HYDROUS EX OINT: 1.0000 "application " | TOPICAL_OINTMENT | CUTANEOUS | Status: DC | PRN

## 2012-05-19 MED ORDER — ACETAMINOPHEN 10 MG/ML IV SOLN
1000.0000 mg | Freq: Four times a day (QID) | INTRAVENOUS | Status: AC | PRN
  Administered 2012-05-19: 1000 mg via INTRAVENOUS
  Filled 2012-05-19: qty 100

## 2012-05-19 MED ORDER — MENTHOL 3 MG MT LOZG: 1.0000 | LOZENGE | OROMUCOSAL | Status: DC | PRN

## 2012-05-19 MED ORDER — SCOPOLAMINE 1 MG/3DAYS TD PT72
MEDICATED_PATCH | TRANSDERMAL | Status: AC
  Filled 2012-05-19: qty 1

## 2012-05-19 MED ORDER — LACTATED RINGERS IV SOLN
INTRAVENOUS | Status: DC
  Administered 2012-05-19: 09:00:00 via INTRAVENOUS

## 2012-05-19 MED ORDER — IBUPROFEN 600 MG PO TABS
600.0000 mg | ORAL_TABLET | Freq: Four times a day (QID) | ORAL | Status: DC
  Administered 2012-05-19 – 2012-05-21 (×7): 600 mg via ORAL
  Filled 2012-05-19 (×7): qty 1

## 2012-05-19 MED ORDER — FENTANYL CITRATE 0.05 MG/ML IJ SOLN
25.0000 ug | INTRAMUSCULAR | Status: DC | PRN
  Administered 2012-05-19 (×2): 50 ug via INTRAVENOUS

## 2012-05-19 MED ORDER — DIPHENHYDRAMINE HCL 25 MG PO CAPS: 25.0000 mg | ORAL_CAPSULE | Freq: Four times a day (QID) | ORAL | Status: DC | PRN

## 2012-05-19 MED ORDER — SCOPOLAMINE 1 MG/3DAYS TD PT72
1.0000 | MEDICATED_PATCH | Freq: Once | TRANSDERMAL | Status: DC
  Administered 2012-05-19: 1.5 mg via TRANSDERMAL

## 2012-05-19 MED ORDER — MEPERIDINE HCL 25 MG/ML IJ SOLN: 6.2500 mg | INTRAMUSCULAR | Status: DC | PRN

## 2012-05-19 MED ORDER — SIMETHICONE 80 MG PO CHEW
80.0000 mg | CHEWABLE_TABLET | Freq: Three times a day (TID) | ORAL | Status: DC
  Administered 2012-05-19 – 2012-05-21 (×8): 80 mg via ORAL

## 2012-05-19 MED ORDER — ONDANSETRON HCL 4 MG/2ML IJ SOLN: 4.0000 mg | Freq: Three times a day (TID) | INTRAMUSCULAR | Status: DC | PRN

## 2012-05-19 MED ORDER — DIPHENHYDRAMINE HCL 50 MG/ML IJ SOLN: 12.5000 mg | INTRAMUSCULAR | Status: DC | PRN

## 2012-05-19 MED ORDER — KETOROLAC TROMETHAMINE 60 MG/2ML IM SOLN
INTRAMUSCULAR | Status: AC
  Filled 2012-05-19: qty 2

## 2012-05-19 MED ORDER — ONDANSETRON HCL 4 MG PO TABS: 4.0000 mg | ORAL_TABLET | ORAL | Status: DC | PRN

## 2012-05-19 MED ORDER — SODIUM CHLORIDE 0.9 % IJ SOLN: 3.0000 mL | INTRAMUSCULAR | Status: DC | PRN

## 2012-05-19 MED ORDER — OXYTOCIN 40 UNITS IN LACTATED RINGERS INFUSION - SIMPLE MED: 62.5000 mL/h | INTRAVENOUS | Status: AC

## 2012-05-19 MED ORDER — ONDANSETRON HCL 4 MG/2ML IJ SOLN: 4.0000 mg | INTRAMUSCULAR | Status: DC | PRN

## 2012-05-19 MED ORDER — DIPHENHYDRAMINE HCL 25 MG PO CAPS
25.0000 mg | ORAL_CAPSULE | ORAL | Status: DC | PRN
Start: 2012-05-19 — End: 2012-05-21

## 2012-05-19 MED ORDER — TETANUS-DIPHTH-ACELL PERTUSSIS 5-2.5-18.5 LF-MCG/0.5 IM SUSP: 0.5000 mL | Freq: Once | INTRAMUSCULAR | Status: DC

## 2012-05-19 MED ORDER — NALOXONE HCL 0.4 MG/ML IJ SOLN: 0.4000 mg | INTRAMUSCULAR | Status: DC | PRN

## 2012-05-19 NOTE — Progress Notes (Signed)
Subjective: Postpartum Day 1: Cesarean Delivery Patient reports no problems voiding.  Wants circ.  Counseled re: R/B/A.  Objective: Vital signs in last 24 hours: Temp:  [97.8 F (36.6 C)-98.6 F (37 C)] 98.6 F (37 C) (05/10 0730) Pulse Rate:  [59-92] 61 (05/10 0730) Resp:  [14-32] 16 (05/10 0730) BP: (94-136)/(44-86) 98/59 mmHg (05/10 0730) SpO2:  [96 %-100 %] 96 % (05/10 0730) Weight:  [151 lb 3.2 oz (68.584 kg)] 151 lb 3.2 oz (68.584 kg) (05/09 2109)  Physical Exam:  General: alert, cooperative and appears stated age Lochia: appropriate Uterine Fundus: firm Incision: healing well, no significant drainage, no dehiscence DVT Evaluation: No evidence of DVT seen on physical exam. Negative Homan's sign. No cords or calf tenderness.   Recent Labs  05/18/12 2207 05/19/12 0635  HGB 10.8* 10.0*  HCT 31.5* 29.3*    Assessment/Plan: Status post Cesarean section. Doing well postoperatively.  Continue current care. Circ today.  Shevon Sian 05/19/2012, 9:37 AM

## 2012-05-19 NOTE — Transfer of Care (Signed)
Immediate Anesthesia Transfer of Care Note  Patient: Tasha Diaz  Procedure(s) Performed: Procedure(s): REPEAT CESAREAN SECTION (N/A)  Patient Location: PACU  Anesthesia Type:Spinal  Level of Consciousness: awake, alert  and oriented  Airway & Oxygen Therapy: Patient Spontanous Breathing  Post-op Assessment: Report given to PACU RN and Post -op Vital signs reviewed and stable  Post vital signs: Reviewed and stable  Complications: No apparent anesthesia complications

## 2012-05-19 NOTE — Anesthesia Postprocedure Evaluation (Signed)
  Anesthesia Post-op Note  Anesthesia Post Note  Patient: Tasha Diaz  Procedure(s) Performed: Procedure(s) (LRB): REPEAT CESAREAN SECTION (N/A)  Anesthesia type: Spinal  Patient location: PACU  Post pain: Pain level controlled  Post assessment: Post-op Vital signs reviewed  Last Vitals:  Filed Vitals:   05/19/12 0100  BP: 117/66  Pulse: 72  Temp:   Resp: 14    Post vital signs: Reviewed  Level of consciousness: awake  Complications: No apparent anesthesia complications

## 2012-05-19 NOTE — Progress Notes (Signed)
Pt has history of exercise induced asthma as teen but none recently.  Previous smoker but not in past year

## 2012-05-19 NOTE — Anesthesia Postprocedure Evaluation (Signed)
  Anesthesia Post-op Note  Patient: Tasha Diaz  Procedure(s) Performed: Procedure(s): REPEAT CESAREAN SECTION (N/A)  Patient Location: Mother/Baby  Anesthesia Type:Spinal  Level of Consciousness: awake, alert  and oriented  Airway and Oxygen Therapy: Patient Spontanous Breathing  Post-op Pain: mild  Post-op Assessment: Post-op Vital signs reviewed, Patient's Cardiovascular Status Stable, No headache, No backache, No residual numbness and No residual motor weakness  Post-op Vital Signs: Reviewed and stable  Complications: No apparent anesthesia complications

## 2012-05-20 NOTE — Progress Notes (Signed)
Subjective: Postpartum Day 2: Cesarean Delivery Patient reports tolerating PO and no problems voiding.  Requests circ.  Objective: Vital signs in last 24 hours: Temp:  [97.9 F (36.6 C)-98.7 F (37.1 C)] 97.9 F (36.6 C) (05/11 0635) Pulse Rate:  [51-84] 51 (05/11 0635) Resp:  [16-18] 18 (05/11 0635) BP: (90-98)/(53-63) 90/53 mmHg (05/11 0635) SpO2:  [97 %] 97 % (05/10 1730)  Physical Exam:  General: alert, cooperative and appears stated age Lochia: appropriate Uterine Fundus: firm Incision: healing well, no significant drainage, no dehiscence DVT Evaluation: No evidence of DVT seen on physical exam. Negative Homan's sign. No cords or calf tenderness.   Recent Labs  05/18/12 2207 05/19/12 0635  HGB 10.8* 10.0*  HCT 31.5* 29.3*    Assessment/Plan: Status post Cesarean section. Doing well postoperatively.  Continue current care. Plan to do circ today.  Pt counseled re: r/b/a.  Tasha Diaz 05/20/2012, 9:35 AM

## 2012-05-21 ENCOUNTER — Encounter (HOSPITAL_COMMUNITY): Payer: Self-pay | Admitting: Obstetrics & Gynecology

## 2012-05-21 MED ORDER — IBUPROFEN 600 MG PO TABS: 600.0000 mg | ORAL_TABLET | Freq: Four times a day (QID) | ORAL | Status: AC

## 2012-05-21 MED ORDER — OXYCODONE-ACETAMINOPHEN 5-325 MG PO TABS: 1.0000 | ORAL_TABLET | ORAL | Status: AC | PRN

## 2012-05-21 NOTE — Discharge Summary (Signed)
Obstetric Discharge Summary Reason for Admission: onset of labor, rupture of membranes and was already scheduled c/s for breech Prenatal Procedures: ultrasound Intrapartum Procedures: cesarean: low cervical, transverse Postpartum Procedures: none Complications-Operative and Postpartum: none Hemoglobin  Date Value Range Status  05/19/2012 10.0* 12.0 - 15.0 g/dL Final     HCT  Date Value Range Status  05/19/2012 29.3* 36.0 - 46.0 % Final    Physical Exam:  General: alert and cooperative Lochia: appropriate Uterine Fundus: firm Incision: honeycomb dressing CDI DVT Evaluation: No evidence of DVT seen on physical exam. Negative Homan's sign. No cords or calf tenderness. No significant calf/ankle edema.  Discharge Diagnoses: Term Pregnancy-delivered  Discharge Information: Date: 05/21/2012 Activity: pelvic rest Diet: routine Medications: PNV, Ibuprofen and Percocet Condition: stable Instructions: refer to practice specific booklet Discharge to: home   Newborn Data: Live born female  Birth Weight: 6 lb 12.6 oz (3079 g) APGAR: 9, 9  Home with mother.  CURTIS,CAROL G 05/21/2012, 8:31 AM

## 2012-05-21 NOTE — Lactation Note (Signed)
This note was copied from the chart of Tasha Turkey Raynor. Lactation Consultation Note [phone]: Discussed feeds with mom; mom states baby does not have a good latch; mom and dad have been finger feeding baby. Mom is filling; very full; getting engorged. Discussed engorgement management, enc ice and pumping. Mom states baby recently fed, he is sleeping in the bassinet; inst mom to call when he is ready for his next feeding, direct number provided.  1100: Mom called states baby is about to feed. Offered to assist, mom accepts. Mom has not been using the nipple shield, and baby has not been latching. Advised nipple shield, mom placed it on right breast and positioned baby in football on the right. Baby latched to the nipple shield for a minute, with few suckles, no evidence of br milk in the shield. Squirted 1 mL br milk into the shield, baby sucked it then stopped sucking.   Mom had previously pumped, and had about 25 mL expressed br milk which dad fed to baby via finger feeding with curved tip syringe.   Discussed feeding options with mom and dad, including finger feeding and bottle feeding. Mom and dad prefer to not [place] a bottle at this time. I do not think an SNS is appropriate since baby does not maintain suction at the breast even using a nipple shield. Mom and dad prefer to finger feed. Advised them to use a larger volume syringe attached to a feeding tube, and this is the option they liked the best. Provided the equipment they need and demonstrated how to use it. Inst parents that using a bottle as baby requires more volume is a valid option.   Outpatient appointment made for lactation help for this Friday, May 16, the first available appt this week. Questions answered.   Patient Name: Tasha Diaz Today's Date: 05/21/2012 Reason for consult: Follow-up assessment;Difficult latch;Late preterm infant   Maternal Data    Feeding Feeding Type: Breast Milk Feeding method: Finger Length of  feed: 20 min  LATCH Score/Interventions Latch: Too sleepy or reluctant, no latch achieved, no sucking elicited. Intervention(s): Skin to skin;Waking techniques Intervention(s): Assist with latch;Adjust position;Breast massage;Breast compression  Audible Swallowing: None Intervention(s): Skin to skin;Hand expression  Type of Nipple: Everted at rest and after stimulation  Comfort (Breast/Nipple): Engorged, cracked, bleeding, large blisters, severe discomfort Problem noted: Engorgment Intervention(s): Ice;Hand expression;Reverse pressure  Interventions (Filling): Massage;Reverse pressure;Firm support;Frequent nursing;Double electric pump Interventions (Mild/moderate discomfort): Hand massage;Hand expression;Reverse pressue;Pre-pump if needed  Hold (Positioning): No assistance needed to correctly position infant at breast. Intervention(s): Breastfeeding basics reviewed;Support Pillows;Position options;Skin to skin  LATCH Score: 4  Lactation Tools Discussed/Used Tools: Nipple Shields Breast pump type: Double-Electric Breast Pump Pump Review: Milk Storage   Consult Status Consult Status: Follow-up Date: 05/25/12 Follow-up type: Out-patient    Octavio Manns [facility] 05/21/2012, 11:47 AM

## 2012-05-21 NOTE — Progress Notes (Signed)
Post discharge review completed. 

## 2012-05-25 ENCOUNTER — Ambulatory Visit (HOSPITAL_COMMUNITY)
Admit: 2012-05-25 | Discharge: 2012-05-25 | Disposition: A | Discharge status: Home / Self Care | Payer: BC Managed Care – PPO | Attending: Family Medicine | Admitting: Family Medicine

## 2012-05-25 NOTE — Lactation Note (Signed)
Lactation Consultation Note  Patient Name: Tasha Diaz  Baby: Silvio Clayman, 45 days old Today's Date: 05/25/2012   DOB: [birth year] Repeat C/S @ 37w    BW: 6# 12.6 oz (3079g)       Today's weight: 6# 13.0 oz (1610R)  Initial feeding assessment:  Pre-weight: 3088g Post-weight: 3106g  Amount transferred: 18mL Comment: R side, bare breast, over 30 min  Additional feeding assessment:  Pre-weight: 3106g Post-weight: 3116 Amount transferred: 10 mL (9 of formula + 1 EBM), in 25 min Comment: L breast at 1350; SNS added a few minutes after w/formula (30ccs)  Breast milk transferred:21mL Supplementation given: 9mL (formula)  Baby's lowest weight has been 6# 3.8 oz, but baby is now back to BW (and slightly above at 1 week of life).  Parents have been finger-feeding baby (w/makeshift SNS: syringe + 5 Fr).  Baby receives 2.5-4 oz q3-5 hours.  Baby has good output (6 wets/day; multiple yellowish, seedy BMs).  Mom pumps q2-3 hours x 30 min= 3.5 to 4.0 oz (sometimes 2.5oz).Breasts are full; nipples have swollen aspects bilaterally in a circumferential pattern (most likely from pumping).  Mom says pumping is painful; she has been using size 24 flanges.  Mom advised to use size 27 flanges that she has at home & they may be lubricated w/olive oil or lanolin beforehand.    Baby has become quite used to being finger-fed (he apparently consumes a whole feeding in about 5 min).  Baby was put to the breast and he only took 18 mL in 30 min.  We added moist heat to Mom's breasts to aid flow.  To facilitate the flow of finger-feeding, we added an SNS to Mom's other breast.  However, he took only 10 mL in 25 min (9 of which was formula).    Mom does not desire to take the SNS home.  Mom says she may choose to pump & finger-feed b/c then she can ensure baby's adequate intake in less amount of time.  Mom made aware that there is a more advanced SNS that has a faster flow that could work.  Mom says she will think about  it.  Lurline Hare [facility] 05/25/2012, 1:02 PM

## 2012-05-30 ENCOUNTER — Other Ambulatory Visit (HOSPITAL_COMMUNITY): Payer: BC Managed Care – PPO

## 2012-06-01 ENCOUNTER — Encounter (HOSPITAL_COMMUNITY): Admission: RE | Payer: Self-pay | Source: Ambulatory Visit

## 2012-06-01 ENCOUNTER — Inpatient Hospital Stay (HOSPITAL_COMMUNITY)
Admission: RE | Admit: 2012-06-01 | Payer: BC Managed Care – PPO | Source: Ambulatory Visit | Admitting: Obstetrics and Gynecology

## 2012-06-01 SURGERY — Surgical Case
Anesthesia: Regional

## 2013-08-24 IMAGING — US US OB < 14 WEEKS - US OB TV
1 series · 14 of 28 positions shown · non-contrast
Comparison: none

REASON FOR EXAM: vaginal bleeding with confirmed pregnancy test
COMMENTS:

[Series 1: us ob < 14 weeks - us ob tv · 0.25mm/px · 14 of 59 slices shown]
[im 3/59]
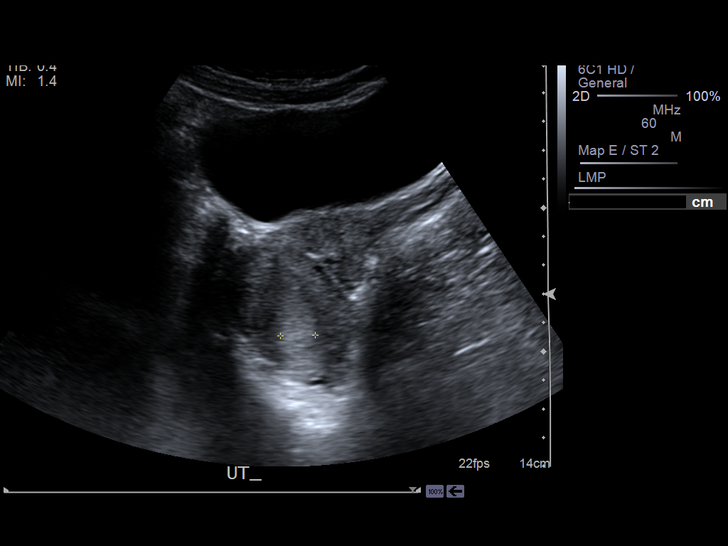
[im 7/59]
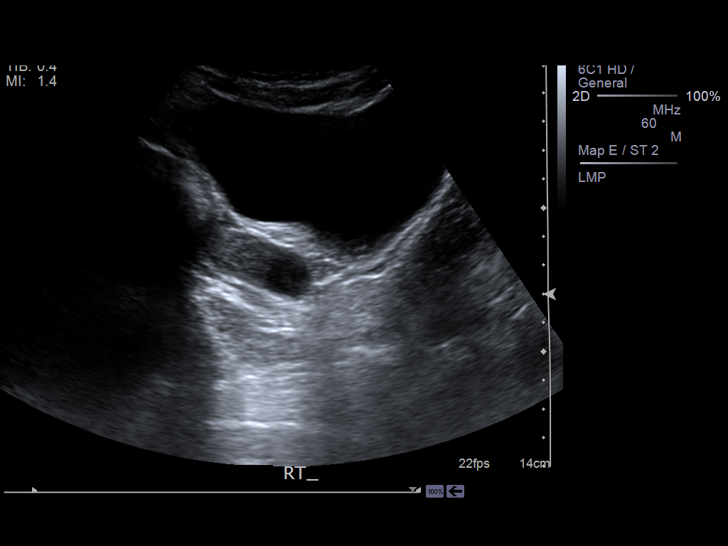
[im 11/59]
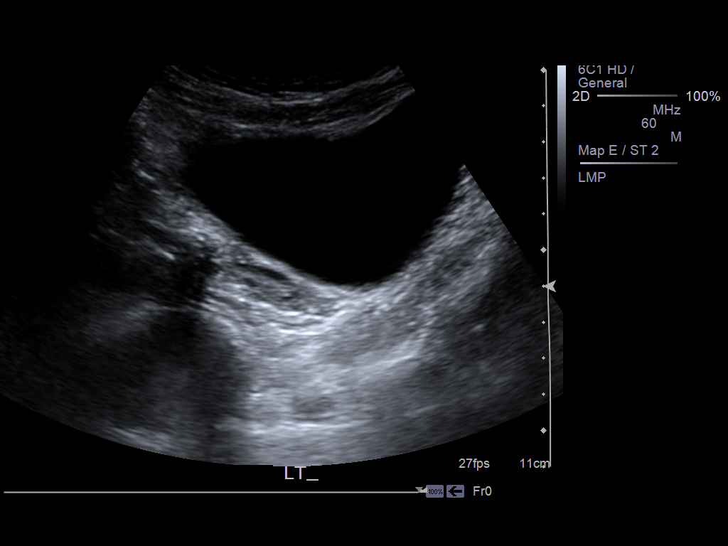
[im 16/59]
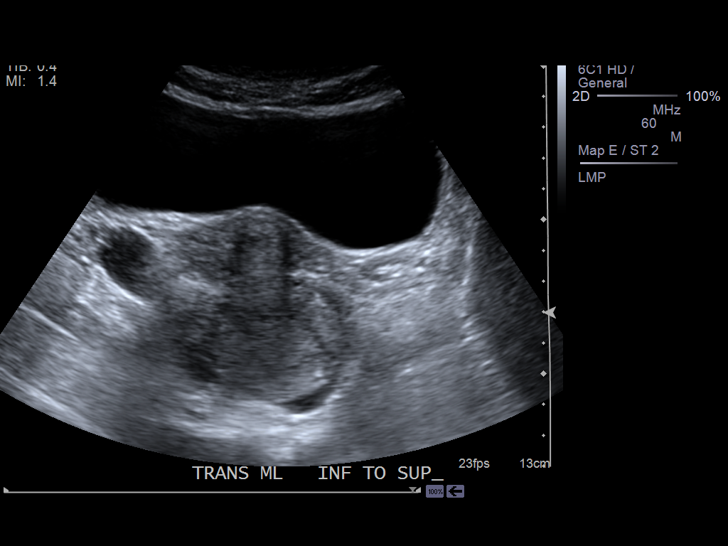
[im 20/59]
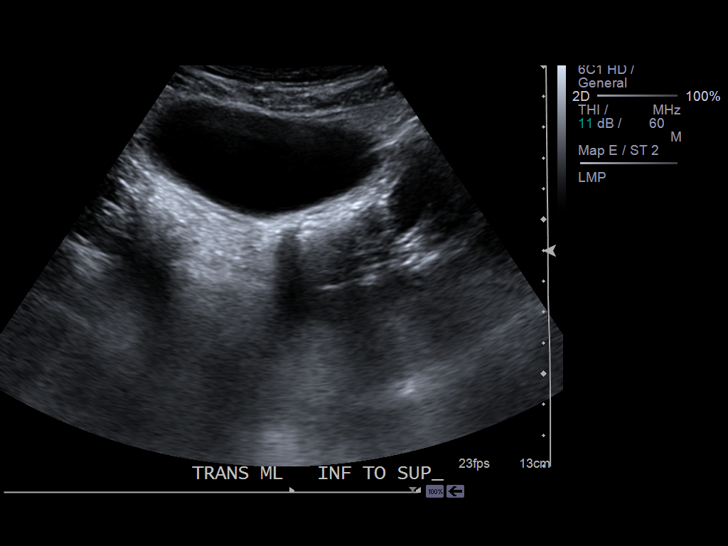
[im 24/59]
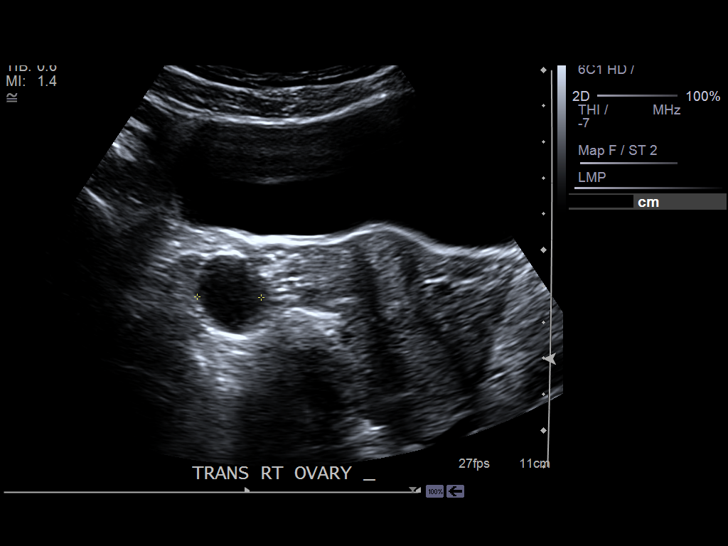
[im 28/59]
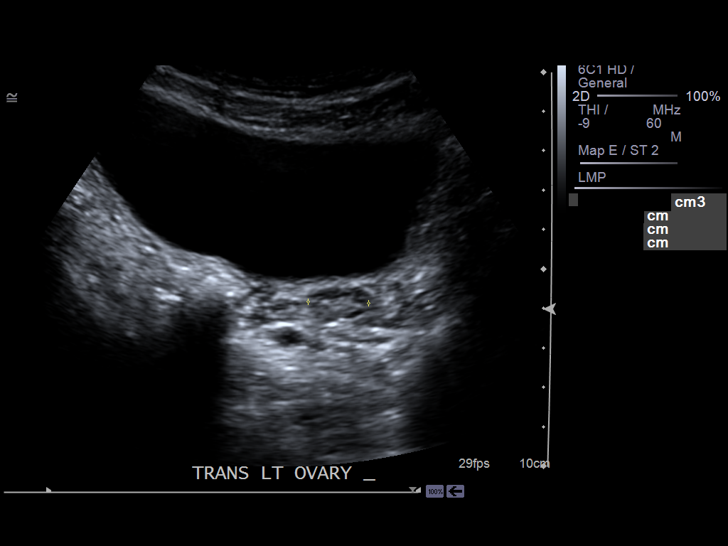
[im 33/59]
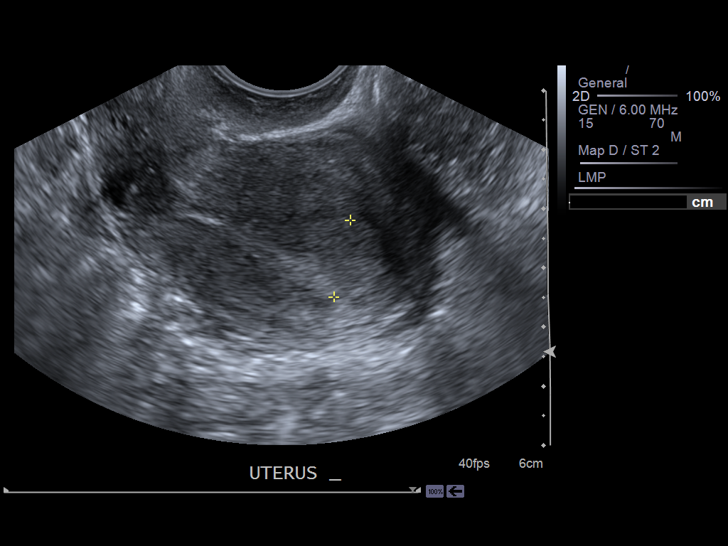
[im 37/59]
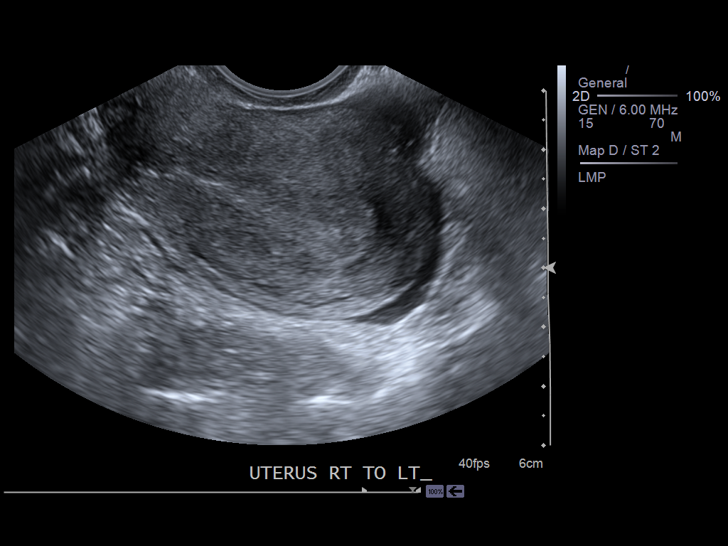
[im 41/59]
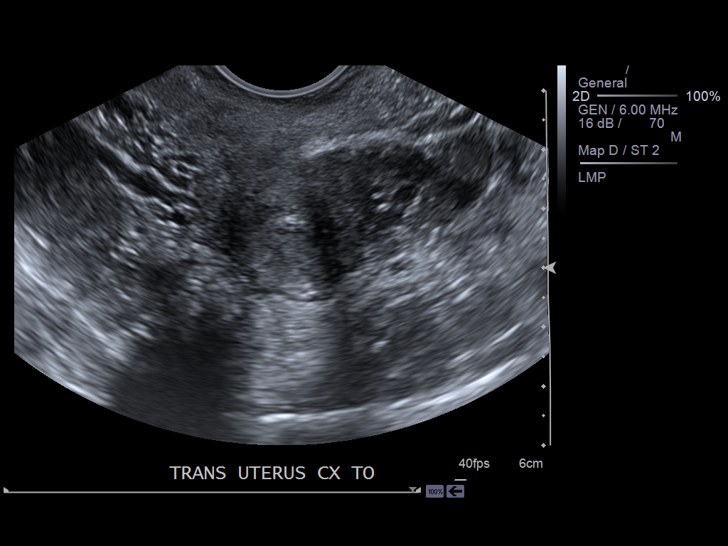
[im 46/59]
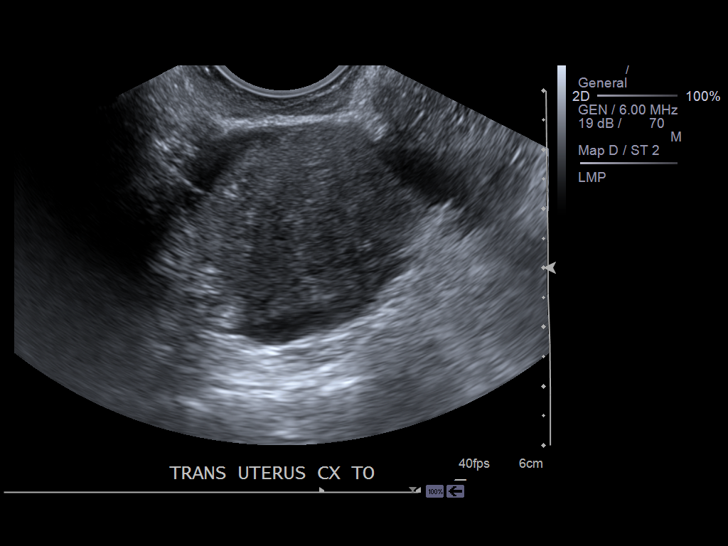
[im 50/59]
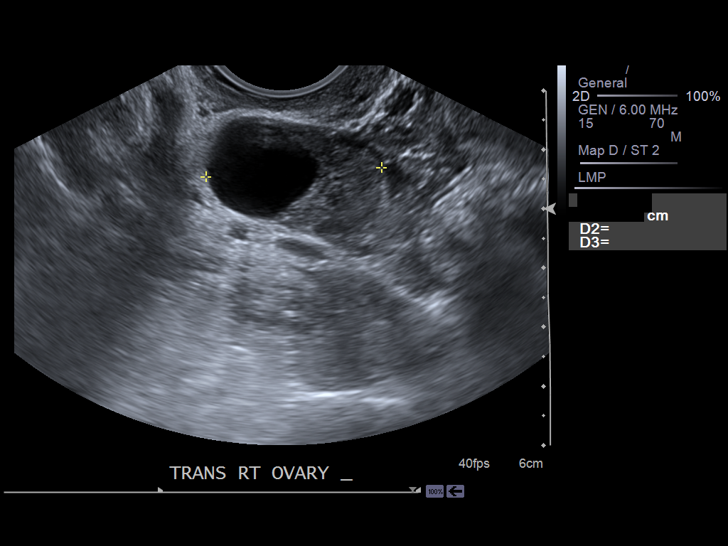
[im 54/59]
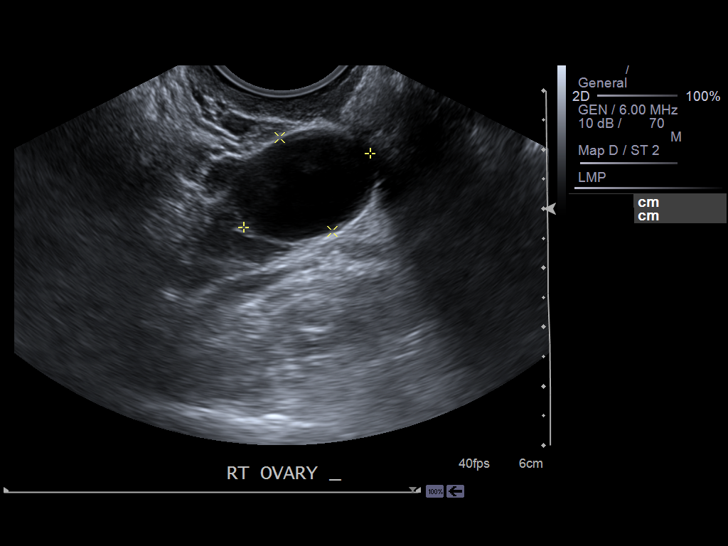
[im 59/59]
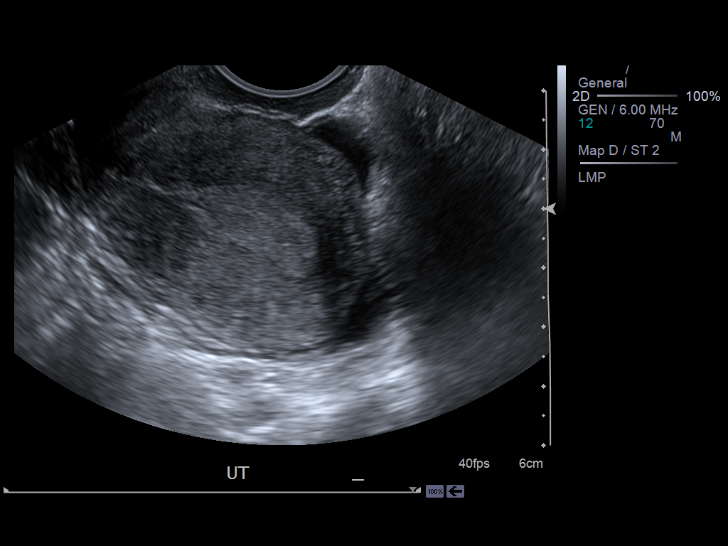

[14 of 28 positions shown; findings below may reference images not displayed]

PROCEDURE:     US  - US OB LESS THAN 14 WEEKS/W TRANS  - October 01, 2011  [DATE]

RESULT:     Transabdominal and endovaginal pelvic sonogram is performed. The
uterus is retroverted measuring 5.47 x 3.49 x 3.3 cm with an endometrial
stripe thickness measuring 12.1 mm. There is no endometrial gestational sac
or abnormal fluid collection. The right ovary measures 3.63 x 1.53 x 2.54 cm
with the cyst of 2.16 x 1.77 x 1.66 cm. Blood flow is present in the right
ovary. The left ovary measures 3.07 x 1.14 x 1.53 cm and shows blood flow
without a solid or cystic mass. The kidneys show no obstruction. On
endovaginal imaging the endometrial stripe thickness is 13.3 mm.  Right
ovarian cyst. On endovaginal images this measures 2.48 x 1.2 x 2.05 cm.
IMPRESSION: 1. No intrauterine gestation. Slightly thickened endometrium. Right ovarian
cyst present. Blood flow present in both ovaries.

[REDACTED]

## 2013-11-11 ENCOUNTER — Encounter (HOSPITAL_COMMUNITY): Payer: Self-pay | Admitting: Obstetrics & Gynecology

## 2014-05-20 NOTE — H&P (Signed)
L&D Evaluation:  History:  HPI 39 yo G2P1001 at [redacted]w[redacted]d gestational age by LMP whose pregnancy is complicated by a history of a prior c-section for oligohydramnions at 37 weeks and a low lying placenta this pregnancy.  She presents with intractable nausea and vomiting and mild diarrhea.  She states that her 67 year-old son has a similar GI illlness. She notes good positive fetal movement, denies leakage of fluid and vaginal bleeding. She has had occasional "braxton-hicks" contractions.  Additionally, she has had a recent sinus infection for which she has been taking Amoxicillin.  She was unable to take this medication all day yesterday due to her GI illness. A+, RPR NR, RI, HBsAg neg, GBS unk   Patient's Medical History No Chronic Illness   Patient's Surgical History Previous C-Section   Medications Pre Natal Vitamins  Amoxicillin   Allergies NKDA   Social History none   Family History Non-Contributory   ROS:  ROS All systems were reviewed.  HEENT, CNS, GI, GU, Respiratory, CV, Renal and Musculoskeletal systems were found to be normal., unless noted in HPI   Exam:  Vital Signs stable  (Somewhat hypotensive, but not tachycardic)   General no apparent distress   Mental Status clear   Chest clear   Heart normal sinus rhythm   Abdomen gravid, non-tender   Estimated Fetal Weight Average for gestational age   Back no CVAT   Edema no edema   Mebranes Intact   FHT normal rate with no decels   FHT Description 150/mod var/+10x10 accels/no decels   Ucx occasional, intermittent.  Have now stopped since arrival   Impression:  Impression dehydration, Gastroenteritis   Plan:  Plan EFM/NST, fluids   Comments - IV antiemetics, IV fluid bolus -now ready for food challenge as she states that her nausea has improved. - If tolerates PO challenge, will send home with zofran.   Follow Up Appointment need to schedule   Electronic Signatures: Will Bonnet (MD)  (Signed  24-Feb-14 08:19)  Authored: L&D Evaluation   Last Updated: 24-Feb-14 08:19 by Will Bonnet (MD)
